# Patient Record
Sex: Male | Born: 1985 | Race: White | Hispanic: No | Marital: Single | Smoking: Never smoker
Health system: Southern US, Community
[De-identification: ages and names within clinical notes are randomized; demographics above are authoritative.]

## PROBLEM LIST (undated history)

## (undated) DIAGNOSIS — G4733 Obstructive sleep apnea (adult) (pediatric): Secondary | ICD-10-CM

## (undated) DIAGNOSIS — F32A Depression, unspecified: Secondary | ICD-10-CM

## (undated) DIAGNOSIS — E119 Type 2 diabetes mellitus without complications: Secondary | ICD-10-CM

## (undated) DIAGNOSIS — M549 Dorsalgia, unspecified: Secondary | ICD-10-CM

## (undated) DIAGNOSIS — K589 Irritable bowel syndrome without diarrhea: Secondary | ICD-10-CM

## (undated) DIAGNOSIS — F329 Major depressive disorder, single episode, unspecified: Secondary | ICD-10-CM

## (undated) DIAGNOSIS — R42 Dizziness and giddiness: Secondary | ICD-10-CM

## (undated) DIAGNOSIS — N200 Calculus of kidney: Secondary | ICD-10-CM

## (undated) DIAGNOSIS — M779 Enthesopathy, unspecified: Secondary | ICD-10-CM

## (undated) HISTORY — DX: Major depressive disorder, single episode, unspecified: F32.9

## (undated) HISTORY — DX: Depression, unspecified: F32.A

## (undated) HISTORY — DX: Obstructive sleep apnea (adult) (pediatric): G47.33

## (undated) HISTORY — DX: Calculus of kidney: N20.0

## (undated) HISTORY — DX: Dizziness and giddiness: R42

## (undated) HISTORY — DX: Irritable bowel syndrome, unspecified: K58.9

---
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
## 2006-01-03 ENCOUNTER — Ambulatory Visit: Payer: Self-pay | Admitting: Family Medicine

## 2006-01-29 ENCOUNTER — Ambulatory Visit: Payer: Self-pay | Admitting: Urology

## 2006-03-05 ENCOUNTER — Ambulatory Visit: Payer: Self-pay | Admitting: Urology

## 2007-01-24 ENCOUNTER — Ambulatory Visit: Payer: Self-pay | Admitting: Urology

## 2007-02-28 ENCOUNTER — Ambulatory Visit (HOSPITAL_COMMUNITY): Admission: RE | Admit: 2007-02-28 | Discharge: 2007-02-28 | Payer: Self-pay | Admitting: Urology

## 2007-05-08 ENCOUNTER — Encounter (INDEPENDENT_AMBULATORY_CARE_PROVIDER_SITE_OTHER): Payer: Self-pay | Admitting: Urology

## 2007-05-08 ENCOUNTER — Inpatient Hospital Stay (HOSPITAL_COMMUNITY): Admission: RE | Admit: 2007-05-08 | Discharge: 2007-05-10 | Payer: Self-pay | Admitting: Urology

## 2007-06-20 ENCOUNTER — Ambulatory Visit (HOSPITAL_BASED_OUTPATIENT_CLINIC_OR_DEPARTMENT_OTHER): Admission: RE | Admit: 2007-06-20 | Discharge: 2007-06-20 | Payer: Self-pay | Admitting: Urology

## 2007-08-12 ENCOUNTER — Ambulatory Visit (HOSPITAL_COMMUNITY): Admission: RE | Admit: 2007-08-12 | Discharge: 2007-08-12 | Payer: Self-pay | Admitting: Urology

## 2007-09-15 IMAGING — CT CT ABD-PELV W/ CM
2 of 5 series · 16 of 46 positions shown, 18 images · non-contrast
Comparison: none

REASON FOR EXAM: delays    hydronephrosis
COMMENTS:

[Series 2: abdomen · axial · 0.78mm/px · z∈[-78,+338]mm · 13 of 62 slices shown, 15 images]
[im 5/62  soft-tissue]
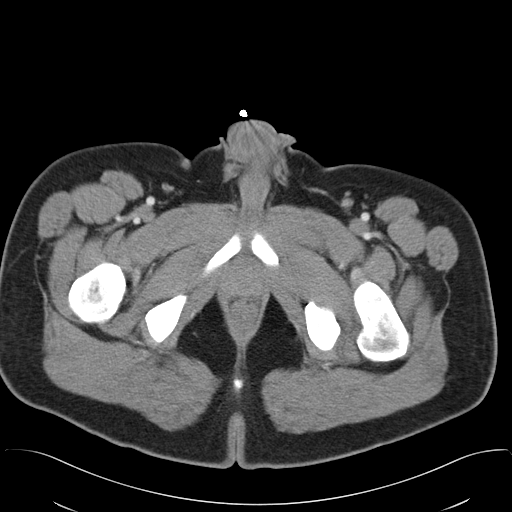
[im 5/62  bone]
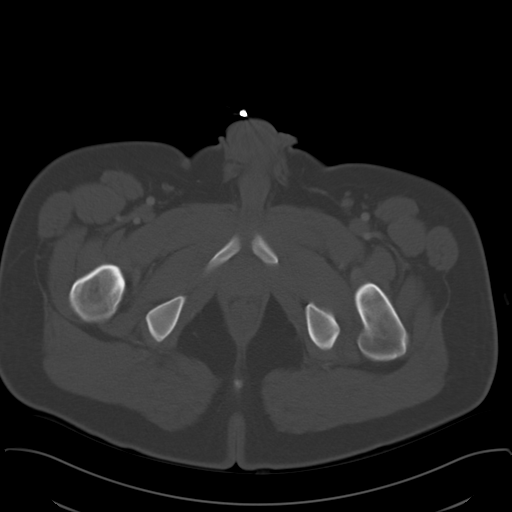
[im 9/62  soft-tissue]
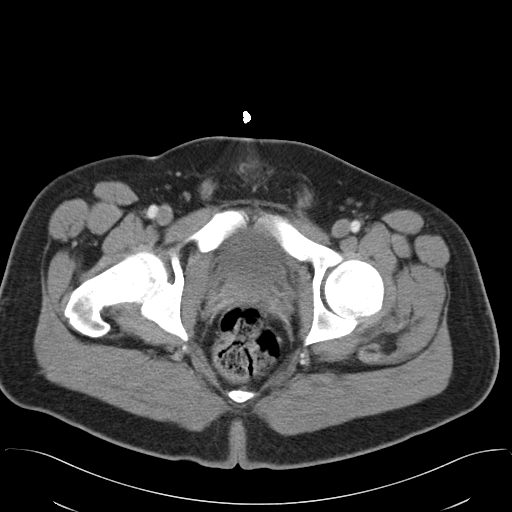
[im 14/62  soft-tissue]
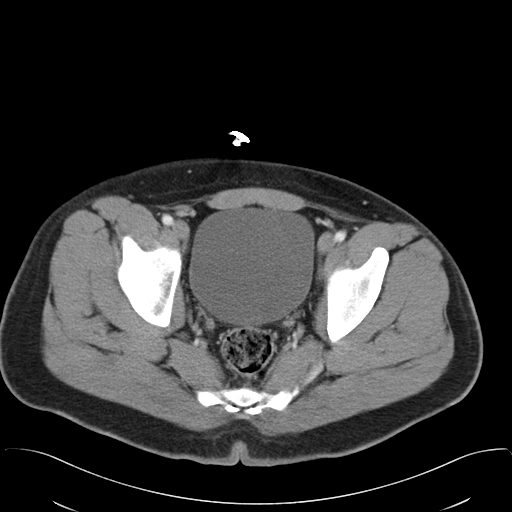
[im 18/62  soft-tissue]
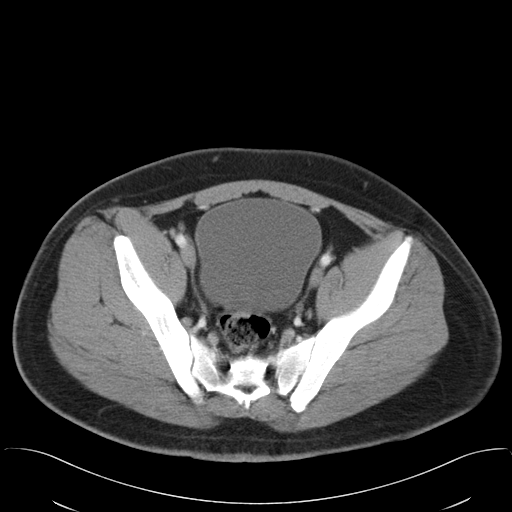
[im 22/62  soft-tissue]
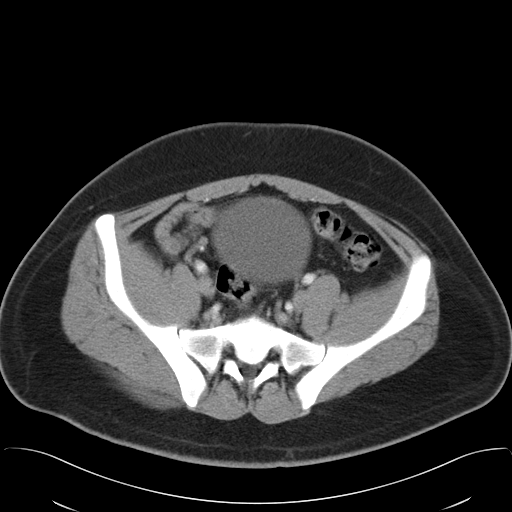
[im 27/62  soft-tissue]
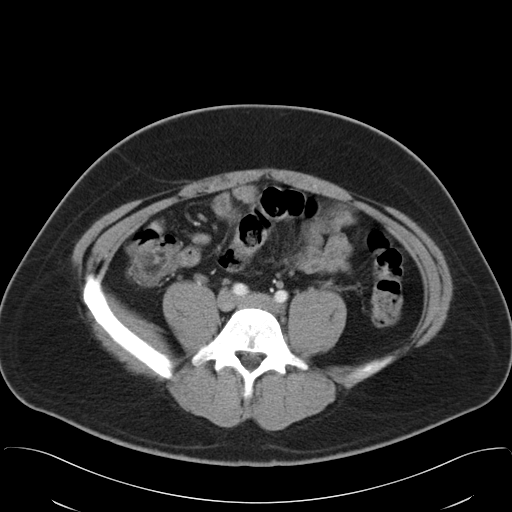
[im 31/62  soft-tissue]
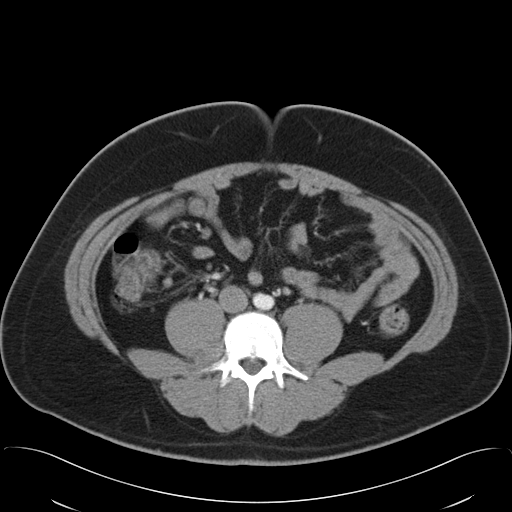
[im 35/62  soft-tissue]
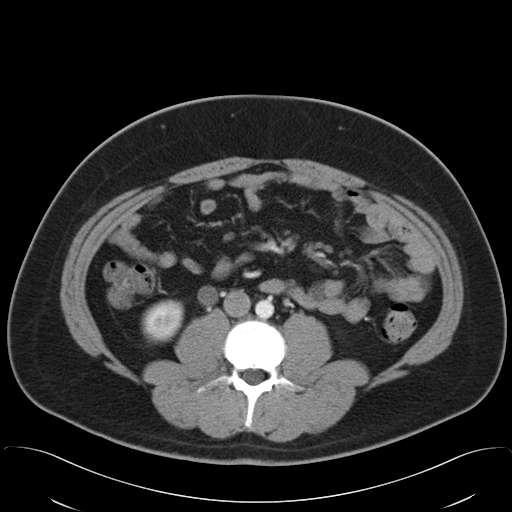
[im 40/62  soft-tissue]
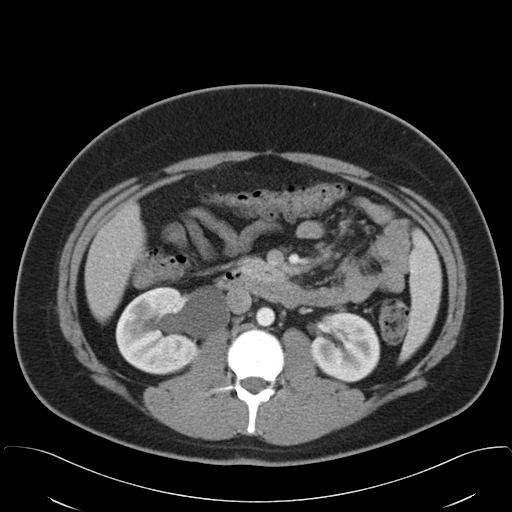
[im 40/62  bone]
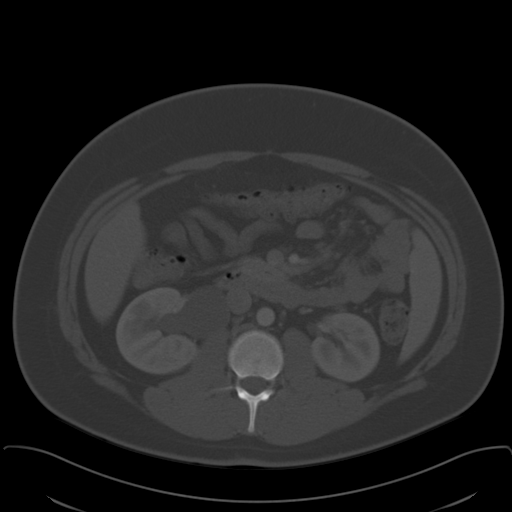
[im 44/62  soft-tissue]
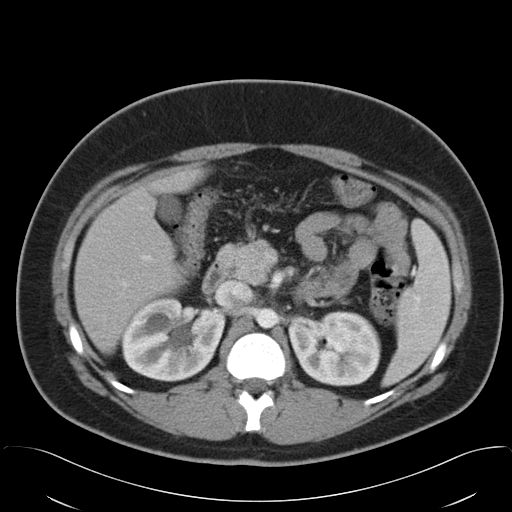
[im 48/62  soft-tissue]
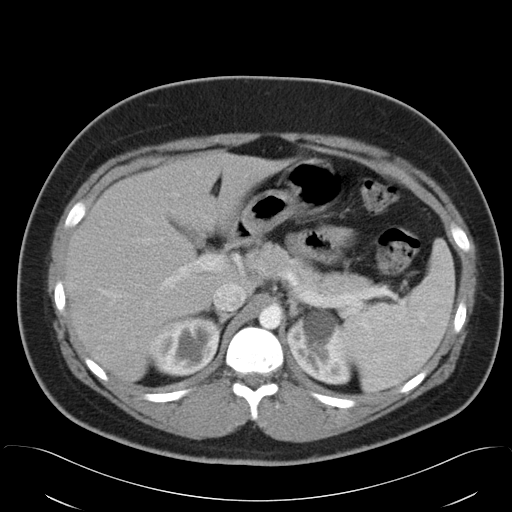
[im 53/62  soft-tissue]
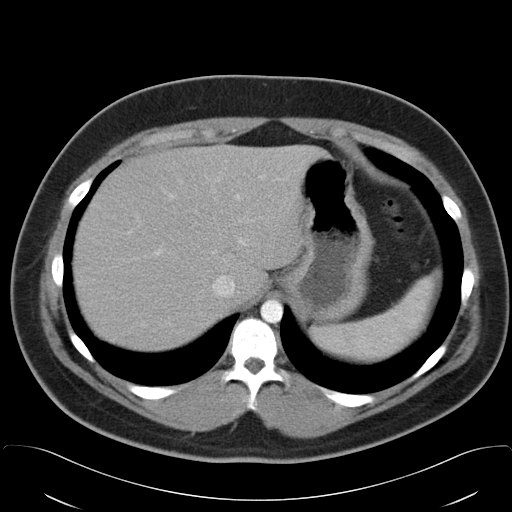
[im 57/62  soft-tissue]
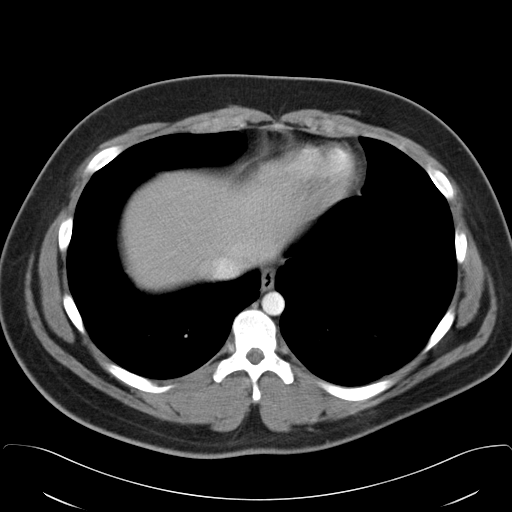

[Series 602: <mpr thick range> · coronal · 0.99mm/px · 3 of 37 slices shown]
[im 13/37  soft-tissue]
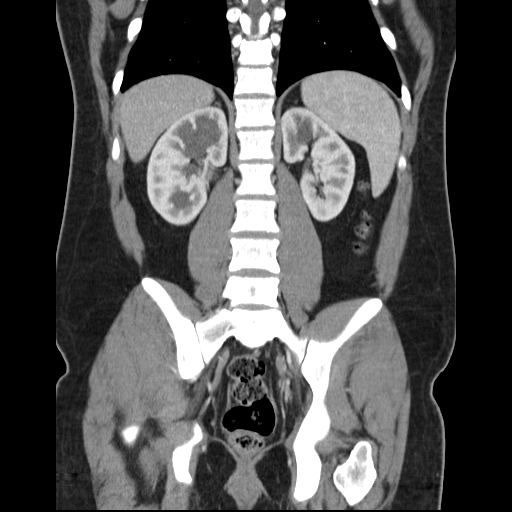
[im 17/37  soft-tissue]
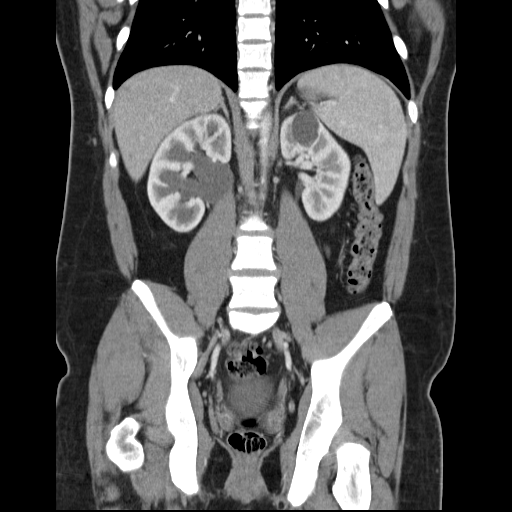
[im 21/37  soft-tissue]
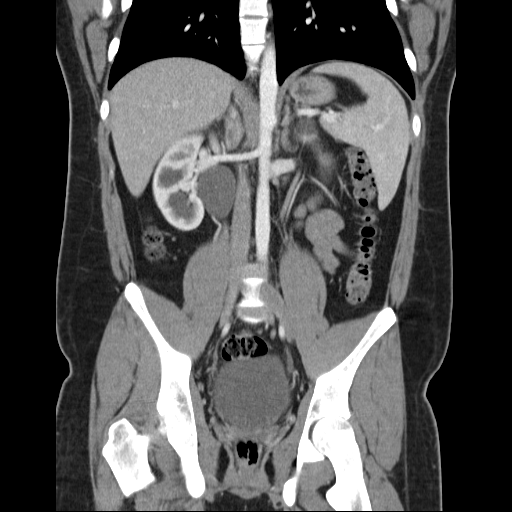

[16 of 46 positions shown; findings below may reference images not displayed]

PROCEDURE:     CT  - CT ABDOMEN / PELVIS  W  - March 05, 2006 [DATE]

RESULT:     IV contrast enhanced CT scan of the abdomen and pelvis with
delayed images is compared to the prior study of 01-29-06.  Today's
examination again shows prominence of the RIGHT renal collecting system with
what appears to be an extrarenal pelvis. No definite level of obstruction is
seen. On the delayed images the renal pelvis and collecting system are not
completely opacified and therefore no contrast is seen in the RIGHT ureter.
The appearance suggests the possibility of an ureteropelvic junction
stenosis. On the LEFT there is noted a cyst in the upper pole of the LEFT
kidney showing an AP dimension of 2.7 cm and a transverse dimension of
cm.  No other focal renal masses or cysts are evident. There is no evidence
of retroperitoneal adenopathy. The aorta is normal in caliber. There is no
significant abnormality seen in the bladder. No stones are evident. The
upper abdominal viscera otherwise appear to be normal.  There is no free
fluid or free air.
IMPRESSION: 1)Probable RIGHT sided UPJ stenosis with hydronephrosis.  Correlation with
retrograde examination may be beneficial.

2)Note is made that the lung bases are normally aerated and clear.

Thank you for the opportunity to contribute to the care of your patient.

ADDENDUM:  03-21-06

Review of the study is made with Dr. Shanrich.  The area of transition from the
hydronephrotic ureter to normal caliber ureter appears to be somewhat lower
than would be expected with a traditional ureteropelvic junction. It is
noted that there are vessels anterior and posterior to the ureter near the
transition site and the possibility of some vascular component to this
stenosis cannot be excluded. During the course of this evaluation it is
noted that there are accessory renal arteries to the LEFT as well.

## 2008-04-16 HISTORY — PX: KIDNEY SURGERY: SHX687

## 2008-06-15 ENCOUNTER — Ambulatory Visit: Payer: Self-pay | Admitting: Urology

## 2009-11-23 ENCOUNTER — Ambulatory Visit: Payer: Self-pay | Admitting: Urology

## 2009-11-28 ENCOUNTER — Ambulatory Visit: Payer: Self-pay | Admitting: Urology

## 2009-12-01 ENCOUNTER — Ambulatory Visit: Payer: Self-pay | Admitting: Urology

## 2009-12-09 ENCOUNTER — Ambulatory Visit: Payer: Self-pay | Admitting: Urology

## 2010-04-12 ENCOUNTER — Ambulatory Visit: Payer: Self-pay | Admitting: Urology

## 2010-04-16 HISTORY — PX: URETERAL STENT PLACEMENT: SHX822

## 2010-04-20 ENCOUNTER — Ambulatory Visit: Payer: Self-pay | Admitting: Urology

## 2010-05-05 ENCOUNTER — Ambulatory Visit: Payer: Self-pay | Admitting: Urology

## 2010-05-16 ENCOUNTER — Ambulatory Visit: Payer: Self-pay | Admitting: Urology

## 2010-05-18 ENCOUNTER — Emergency Department: Payer: Self-pay | Admitting: Emergency Medicine

## 2010-08-29 NOTE — Op Note (Signed)
NAMEKASAI, BELTRAN                ACCOUNT NO.:  [id number]   MEDICAL RECORD NO.:  [id number]          PATIENT TYPE:  INP   LOCATION:  1436                         FACILITY:  [facility]   PHYSICIAN:  Heloise Purpura, MD      DATE OF BIRTH:  March 15, 1986   DATE OF PROCEDURE:  05/08/2007  DATE OF DISCHARGE:  05/10/2007                               OPERATIVE REPORT   PREOPERATIVE DIAGNOSIS:  Right ureteropelvic junction obstruction.   POSTOPERATIVE DIAGNOSIS:  Right ureteropelvic junction obstruction.   PROCEDURE:  1. Cystoscopy.  2. Right retrograde pyelography.  3. Right ureteroscopy.  4. Right ureteral stent placement.  5. Right robotic-assisted laparoscopic dismembered pyeloplasty.  6. Right nephropexy.   SURGEON:  Heloise Purpura, MD   ASSISTANT:  Jennet Maduro, MD   ANESTHESIA:  General.   COMPLICATIONS:  None.   ESTIMATED BLOOD LOSS:  50 mL.   INTRAVENOUS FLUIDS:  2500 mL of lactated Ringer's.   SPECIMEN:  Right proximal ureter with ureteral polyps.   DISPOSITION OF SPECIMEN:  To pathology.   DRAINS:  1. A 16-French Foley catheter.  2. A #15 Blake perinephric drain.   INTRAOPERATIVE FINDINGS:  Right retrograde pyelography demonstrated  significant abnormality of the proximal ureter including multiple small  filling defects at 2 levels of the most proximal ureter.  There was  associated dilation of the renal pelvis and calyceal system of the right  kidney, but no filling defects in the renal collecting system.  No other  identifiable filling defects were noted along the course of the mid and  distal ureter.  Diagnostic ureteroscopy revealed finger-like projections  which appeared smooth and consistent with fibroepithelial polyps.  These  did not appear to raise concern for malignancy.   INDICATIONS:  Jacob Andrews is a 25 year old patient with chronic intermittent  right-sided flank and upper abdominal pain which has occurred for the  majority of his life.  [place] which demonstrated a right  hydronephrosis and findings consistent with a probable UPJ obstruction.  After continuing to have symptoms, he was seen in consultation for a  possible repair of the UPJ obstruction.  The patient elected to proceed  with the above procedures.  Potential risks/benefits, potential  complications, and alternative options were discussed in detail with the  patient and informed consent was obtained.   DESCRIPTION OF PROCEDURE:  The patient was taken to the operating room  and a general anesthetic was administered.  He was given preoperative  antibiotics, placed in the dorsal lithotomy position, and prepped and  draped in the usual sterile fashion.  Next, a preoperative time-out was  performed.  Cystourethroscopy was then performed, which demonstrated no  abnormalities of the urethra or bladder.  The right ureteral orifice was  identified and intubated with a 6-French ureteral catheter.  Contrast  was injected, which revealed findings as stated above; specifically,  there did appear to be a filling defect and abnormality in 2 distinct  areas of the proximal ureter.  Therefore, a 0.038 Sensor guidewire was  advanced up the into the renal pelvis under fluoroscopic guidance and  without difficulty.  A flexible ureteroscope was then advanced over this  wire and the proximal ureter was examined visually.  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  There was  a second area just inferior to the initial site which had smaller, but  similar-appearing projections; these did not appear to represent a  malignancy.  At this point, a quick literature search was performed in  the operating room, helping to confirm the probability that the entities  visualized represented fibroepithelial polyps.   Based on the patient's  age and the fact that he did not have any risk factors for malignancy,  it was decided to proceed with resection and repair of his proximal  ureter.  The 0.038 Sensor guidewire was advanced through the  ureteroscope and up into the renal pelvis again without difficulty.  The  wire was back-loaded over a cystoscope and an 8 x 28 double-J ureteral  stent was advanced over the wire and appropriately positioned under  fluoroscopic and cystoscopic guidance.  The wire was then removed with a  good curl noted in the renal pelvis as well as in the bladder.  A 16-  French Foley catheter was placed and the patient was then repositioned  in the right modified flank position with care to pad all potential  pressure points.  His abdomen was prepped and draped in the usual  sterile fashion and a site was selected just to the right of the  umbilicus for placement of the camera port; this was placed using a  standard open Hasson technique, which allowed entry into the peritoneal  cavity under direct vision and without difficulty.  A 12-mm port was  placed and a pneumoperitoneum was established.  A 0-degree lens was then  used to inspect the abdomen and there was no evidence of any intra-  abdominal injuries or other abnormalities.  The remaining ports were  then placed.  An 8-mm robotic port was placed in the right upper  quadrant and an additional 8-mm robotic port was placed in the right  lower quadrant.  Finally, an additional 8-mm robotic port was placed in  the far inferior and lateral aspect of the right lower quadrant.  All of  these ports were placed under direct vision and without difficulty.  An  additional 12-mm port was placed in the midline superior to the  umbilicus for laparoscopic assistance.  All ports were placed under  direct vision and without difficulty.  The surgical cart was then  docked.  With the aid of the cautery scissors and the [place] bipolar   forceps, the white line of Toldt was incised along the length of the  ascending colon, allowing the colon to be mobilized medially in the  space between the colonic mesentery and Gerota's fascia to be developed.  The ureter was easily identified and was lifted anteriorly off the psoas  muscle with preservation of the gonadal vein.  Care was taken to  preserve the periureteral tissue and blood supply.  The ureter was then  dissected proximally until the renal pelvis was identified.  The renal  pelvis was isolated from the surrounding structures up to the level of  the renal vein.  At this point, the ureter was carefully identified.  There was noted to be an area in the proximal ureter that did  appear  somewhat dysplastic and consistent with the abnormalities seen  ureteroscopically.  The ureter was then incised longitudinally in an  anterior fashion, which revealed the abnormal polypoid lesions within  the proximal ureter.  This area was incised along the length of the  abnormal ureter until normal ureter was seen distally as well as  proximally.  The identified abnormal segment of the proximal ureter was  then excised; this represented an approximately 2.5- to 3-cm area in  length of the proximal ureter.  At this point, it was clear that the  renal pelvis and the proximal aspect of the normal ureter would not be  able to be repaired without tension.  It was decided to fully mobilize  the kidney for a tension-free repair to be performed.  Therefore,  Gerota's fascia was incised, allowing the kidney to be completely  mobilized posteriorly, laterally and superiorly.  The renal vessels were  identified and were isolated, which allowed significant downward  mobilization of the kidney.  The perinephric fat on the inferior and  posterior aspect of the kidney along with some remaining Gerota's fascia  were then secured to the fascia of the psoas muscle with figure-of-eight  2-0 Vicryl sutures in  order to perform a nephropexy and secure the  kidney in the downward position.  At this point, it was felt that a  direct reanastomosis between the renal pelvis and remaining proximal  ureter could be performed in a tension-free fashion.  The periureteral  fat was reapproximated to fat adjacent to the lateral aspect of the  renal pelvis to again help take tension off of the anastomosis.  The  renal pelvis and proximal ureter were spatulated and 4-0 Vicryl sutures  were then used to reapproximate the lateral and medial aspects of the  renal pelvis and ureter in a tension-free fashion.  The posterior  anastomosis was then performed with a running 4-0 Vicryl suture.  The  patient's ureteral stent was then repositioned within the renal pelvis  and the anterior aspect of the anastomosis was closed with a running 4-0  Vicryl suture.  A #15 Blake drain was then brought through the far  inferior and lateral right lower quadrant port and positioned externally  to the anastomosis.  It was secured to the skin with a nylon suture.  The colon was then repositioned in its normal position over the ureter  and renal hilum.  The two 12-mm port sites were then closed with 0  Vicryl sutures which were placed with the aid of the suture passer  device.  The proximal ureteral specimen was removed within an Endopouch  retrieval bag and sent for permanent pathologic analysis.  Remaining  ports were removed under direct vision and the pneumoperitoneum  expelled.  All skin incisions were injected with 0.25% Marcaine and  reapproximated at the skin level with staples.  Sterile dressings were  applied.  The patient appeared to tolerate the procedure well and  without complications.  He was able to be extubated and transferred to  the recovery unit in satisfactory condition.      Heloise Purpura, MD  Electronically Signed     LB/MEDQ  D:  05/10/2007  T:  05/11/2007  Job:  [id number]

## 2010-08-29 NOTE — Discharge Summary (Signed)
[name], [name]                ACCOUNT NO.:  [id number]   MEDICAL RECORD NO.:  [id number]          PATIENT TYPE:  INP   LOCATION:  1436                         FACILITY:  [facility]   PHYSICIAN:  Heloise Purpura, MD      DATE OF BIRTH:  1986-02-07   DATE OF ADMISSION:  05/08/2007  DATE OF DISCHARGE:  05/10/2007                               DISCHARGE SUMMARY   ADMISSION DIAGNOSIS:  Right UPJ obstruction.   DISCHARGE DIAGNOSIS:  Right UPJ obstruction.   HISTORY OF PRESENT ILLNESS:  For full details please see admission  history and physical.  Briefly, Jacob Andrews is a 25 year old patient with  intermittent right-sided flank pain.  He has subsequently been found on  imaging to have findings consistent with a right UPJ obstruction.  After  discussion regarding management options for treatment, he elected to  proceed with surgical treatment and a dismembered pyeloplasty.   HOSPITAL COURSE:  On May 08, 2007, he was taken to the operating  room and underwent cystoscopy, retrograde pyelography, ureteroscopy,  ureteral stent placement, and a right robotic assisted laparoscopic  dismembered pyeloplasty.  He tolerated this procedure well and without  complications.  Postoperatively, he was able to be transferred to a  regular hospital room following recovery from anesthesia.  He was able  to begin ambulating the night of surgery, which he did without  difficulty.  He remained hemodynamically stable and his hematocrit and  renal function were checked the morning of postoperative day #1.  His  hematocrit remained stable at 39.8 and his serum creatinine remained  stable at 0.87.  On postoperative day #1, his diet was advanced until he  was tolerating a regular diet.  He was transitioned to oral pain  medication and his Foley catheter was removed.  His perinephric drain  was monitored and continued to have minimal output.  On the morning of  postoperative day #2, his drain fluid was sent for a  creatinine level  and was consistent with serum at 0.7.  He was therefore able to be  removed and was able to be discharged home in excellent condition.   DISPOSITION:  Home.   DISCHARGE MEDICATIONS:  Jacob Andrews was given a prescription to take Vicodin  as needed for pain and told to use Colace as a stool softener.   DISCHARGE INSTRUCTIONS:  He was instructed to be ambulatory, but  specifically told to refrain from any heavy lifting, strenuous activity,  or driving.  He was instructed that he may resume a regular diet.   FOLLOW-UP:  He will follow-up in the next 10-14 days for further  postoperative evaluation.      Heloise Purpura, MD  Electronically Signed     LB/MEDQ  D:  05/10/2007  T:  05/11/2007  Job:  [id number]

## 2010-08-29 NOTE — H&P (Signed)
NAMEJASIR, ROTHER                ACCOUNT NO.:  [id number]   MEDICAL RECORD NO.:  [id number]          PATIENT TYPE:  INP   LOCATION:  0005                         [facility]ACILITY:  [facility]   PHYSICIAN:  Heloise Purpura, MD      DATE OF BIRTH:  1985-10-01   DATE OF ADMISSION:  05/08/2007  DATE OF DISCHARGE:                              HISTORY & PHYSICAL   CHIEF COMPLAINT:  Right UPJ obstruction.   HISTORY:  Francesco Runner is a 25 year old patient with a history of right flank  pain.  He was recently found to have right hydronephrosis and findings  consistent with a ureteropelvic junction obstruction on a CT scan.  He  underwent a nuclear medicine renal scan which confirmed his diagnosis.  There is question of a crossing of vessel as the possible etiology.  After discussion regarding management options for treatment, the patient  elected to proceed with a minimally invasive dismembered pyeloplasty.   PAST MEDICAL HISTORY:  None.   PAST SURGICAL HISTORY:  None.   MEDICATIONS:  None.   ALLERGIES:  No known drug allergies.   FAMILY HISTORY:  Positive for coronary artery disease and lung cancer.   SOCIAL HISTORY:  The patient denies alcohol or tobacco use.   REVIEW OF SYSTEMS:  All systems are reviewed and are negative.   PHYSICAL EXAMINATION:  CONSTITUTIONAL:  Well-nourished, well-developed,  age-appropriate male in no acute distress.  CARDIOVASCULAR:  Regular rate and rhythm without obvious murmurs.  LUNGS:  Clear bilaterally.  ABDOMEN:  Obese, soft, nontender without abdominal masses.  BACK:  No current CVA tenderness.  EXTREMITIES:  No edema.   IMPRESSION:  Right ureteropelvic junction obstruction.   PLAN:  Francesco Runner will undergo cystoscopy with right ureteral stent  placement.  He will then undergo a robotic assisted laparoscopic  dismembered pyeloplasty and be admitted to the hospital for routine  postoperative care.      Heloise Purpura, MD  Electronically Signed     LB/MEDQ  D:   05/08/2007  T:  05/08/2007  Job:  [id number]

## 2010-08-29 NOTE — Op Note (Signed)
[name], [name]                ACCOUNT NO.:  [id number]   MEDICAL RECORD NO.:  [id number]          PATIENT TYPE:  AMB   LOCATION:  [facility]                         [facility]ACILITY:  [facility]   PHYSICIAN:  Heloise Purpura, MD      DATE OF BIRTH:  07/13/1985   DATE OF PROCEDURE:  06/20/2007  DATE OF DISCHARGE:                               OPERATIVE REPORT   PREOPERATIVE DIAGNOSES:  1. History of right ureteropelvic junction obstruction.  2. Indwelling right ureteral stent.   POSTOPERATIVE DIAGNOSES:  1. History of right ureteropelvic junction obstruction.  2. Indwelling right ureteral stent.   PROCEDURES:  1. Cystoscopy.  2. Removal of right ureteral stent.   SURGEON:  Heloise Purpura, MD   ANESTHESIA:  General.   COMPLICATIONS:  None.   INDICATIONS:  Jacob Andrews is a 25 year old gentleman who recently underwent a  right robotic pyeloplasty for a UPJ obstruction.  He came to the office  earlier this morning for ureteral stent removal.  The stent was unable  to be removed easily and the patient was tolerating this procedure  poorly in the office.  Therefore, it was decided to proceed with removal  of the stent under anesthesia.  Potential risks, complications and  alternative options were discussed with the patient in detail.  An  informed consent was obtained.   DESCRIPTION OF PROCEDURE:  The patient was taken to the operating room  and a general anesthetic was administered.  He was given preoperative  antibiotics, placed in the dorsal lithotomy position, prepped and draped  in the usual sterile fashion.  Next a preoperative time-out was  performed.  Cystourethroscopy was performed and the patient's ureteral  stent was identified and was able to be pulled out of the urethra  without difficulty.  Fluoroscopic guidance was used to monitor the  proximal curl of the stent which appeared to also come out without  difficulty.  The patient tolerated the procedure well without  complications.  His  bladder was emptied prior to returning to the  recovery unit.      Heloise Purpura, MD  Electronically Signed    LB/MEDQ  D:  06/20/2007  T:  06/21/2007  Job:  [id number]

## 2011-01-04 LAB — HEMOGLOBIN AND HEMATOCRIT, BLOOD
HCT: 39.8
HCT: 40.3
Hemoglobin: 14.2

## 2011-01-04 LAB — BASIC METABOLIC PANEL
BUN: 10
BUN: 8
Calcium: 8.6
Calcium: 9.5
Chloride: 102
Chloride: 104
Creatinine, Ser: 0.87
GFR calc Af Amer: >60
GFR calc Af Amer: >60
GFR calc Af Amer: >60
GFR calc non Af Amer: >60
Glucose, Bld: 116 — ABNORMAL HIGH
Potassium: 4.2
Sodium: 138
Sodium: 138
Sodium: 142

## 2011-01-04 LAB — CBC
Hemoglobin: 14.7
Platelets: 212
RBC: 4.67
RDW: 12.8

## 2011-01-04 LAB — CREATININE, FLUID (PLEURAL, PERITONEAL, JP DRAINAGE)

## 2011-01-04 LAB — TYPE AND SCREEN: Antibody Screen: NEGATIVE

## 2011-04-17 HISTORY — PX: APPENDECTOMY: SHX54

## 2011-06-06 ENCOUNTER — Ambulatory Visit: Payer: Self-pay | Admitting: Urology

## 2011-06-13 IMAGING — CR DG ABDOMEN 1V
1 series · 2 of 2 positions shown · non-contrast
Comparison: none

REASON FOR EXAM: right ureteral calculus
COMMENTS:

[Series 1: view not recorded · 0.17mm/px · 2 of 2 slices shown]
[im 1/2]
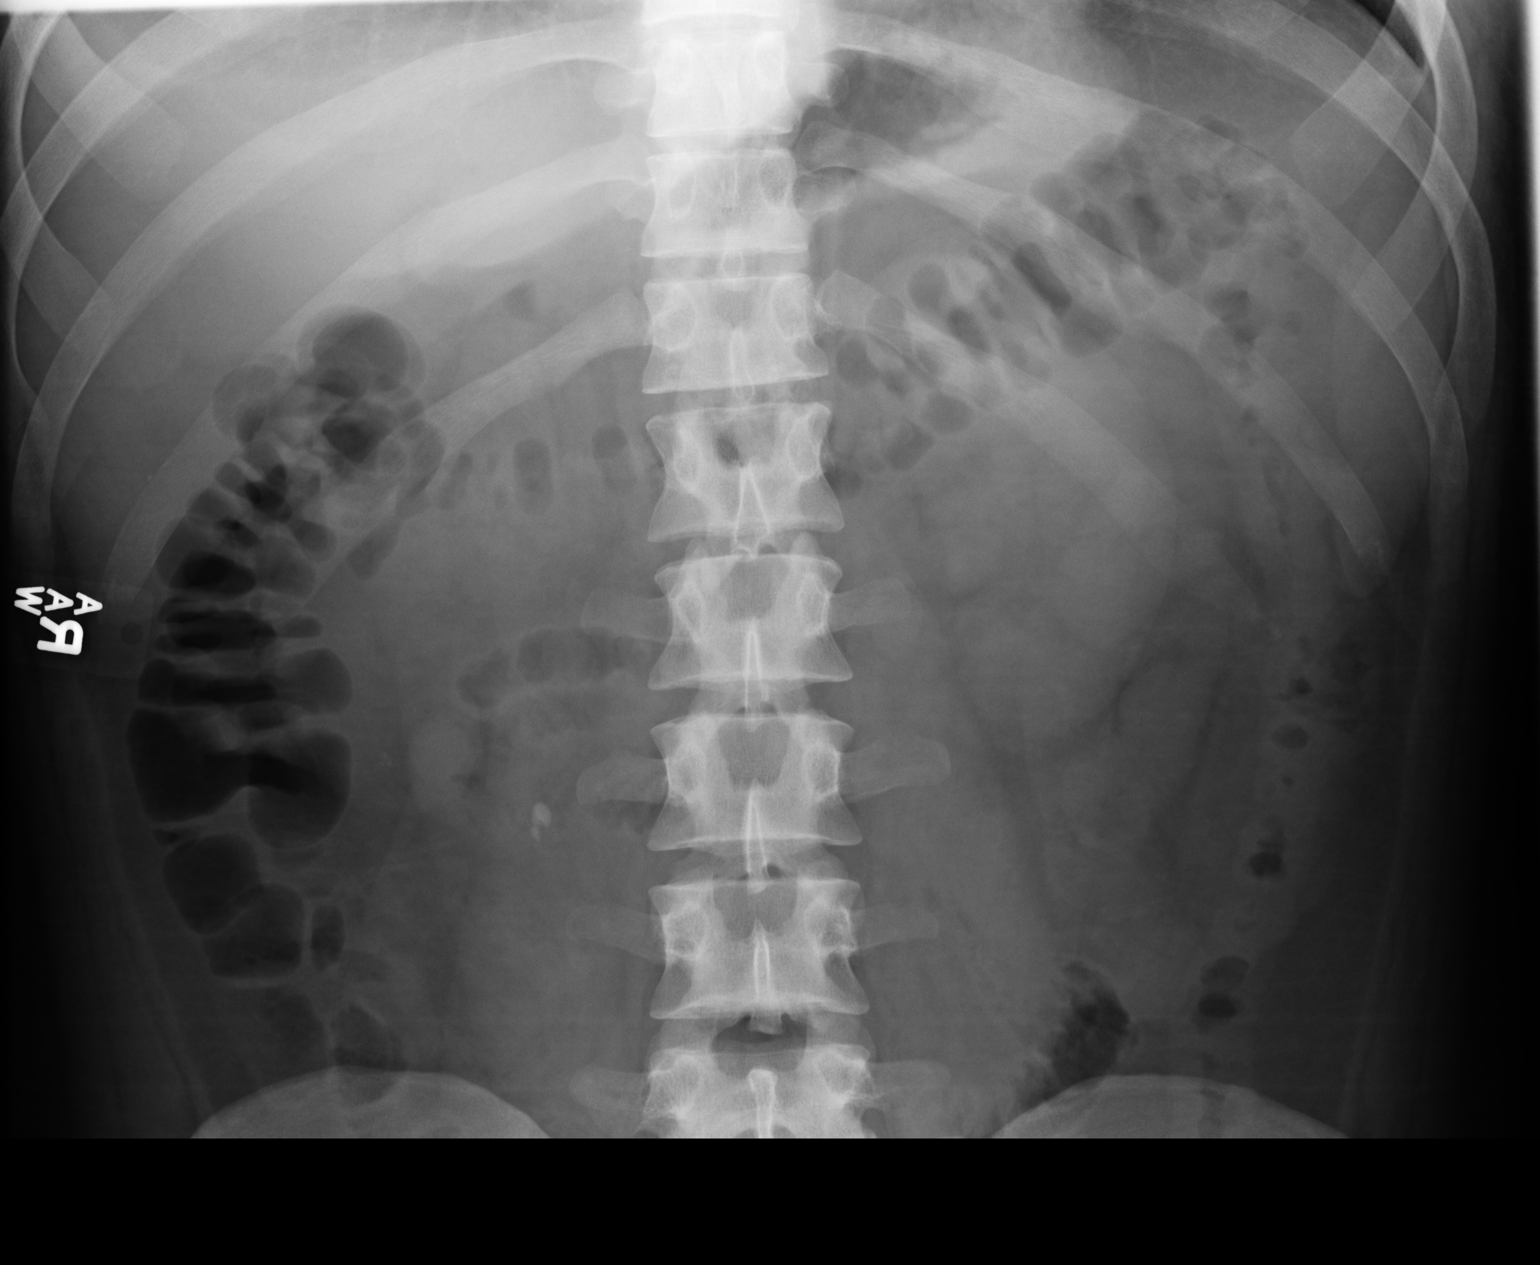
[im 2/2]
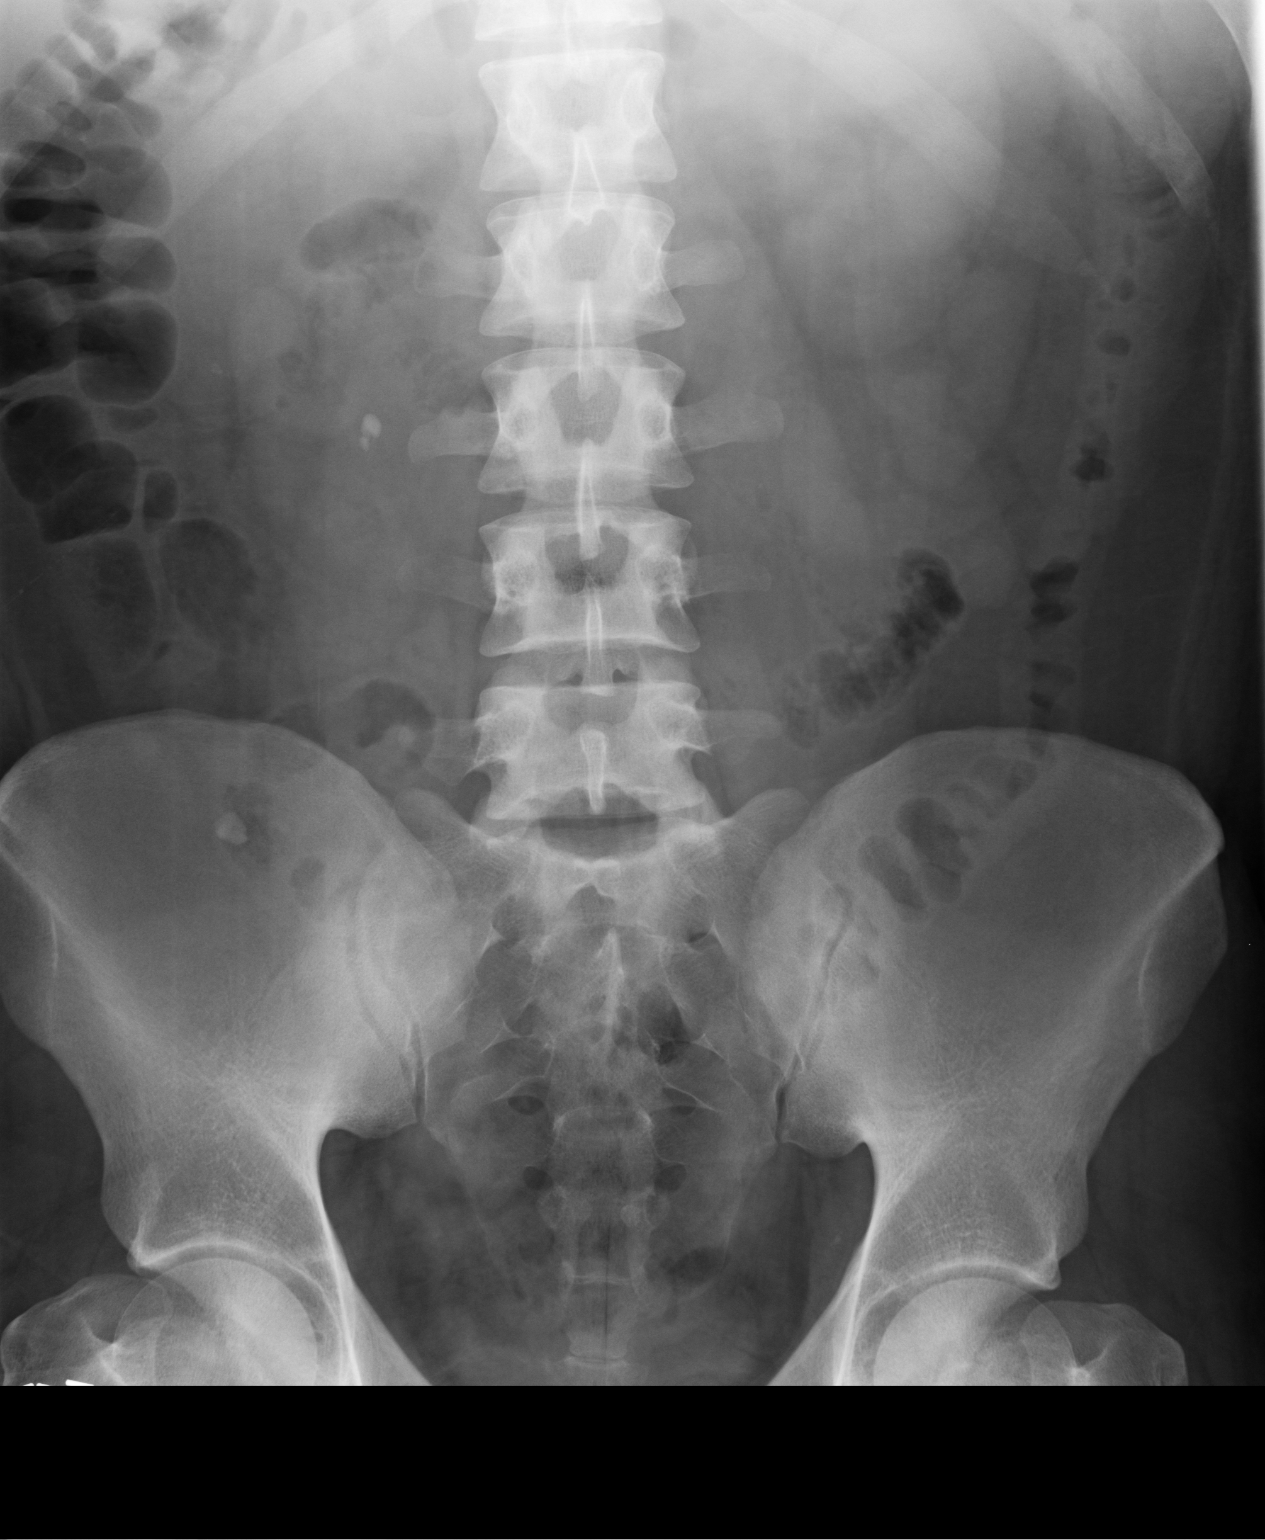

[2 of 2 positions shown; findings below may reference images not displayed]

PROCEDURE:     DXR - DXR KIDNEY URETER BLADDER  - December 01, 2009 [DATE]

RESULT:     Comparison is made to the prior exam of 11/28/2009. There are
again noted two calcifications or calcification fragments projected at the
level of the L3 lumbar vertebral body on the right and consistent with right
ureteral stones. Additionally, there is an apparent calcification at the
lower pole of the right kidney consistent with a right renal stone.
IMPRESSION: 1. Right ureterolithiasis.
2. Probable right nephrolithiasis.
3. No renal or ureteral calcifications are identified on the left.

## 2011-10-31 IMAGING — CR DG ABDOMEN 1V
1 series · 2 of 2 positions shown · non-contrast
Comparison: none

REASON FOR EXAM: Calculus right ureteral
COMMENTS:

[Series 1: view not recorded · 0.17mm/px · 2 of 2 slices shown]
[im 1/2]
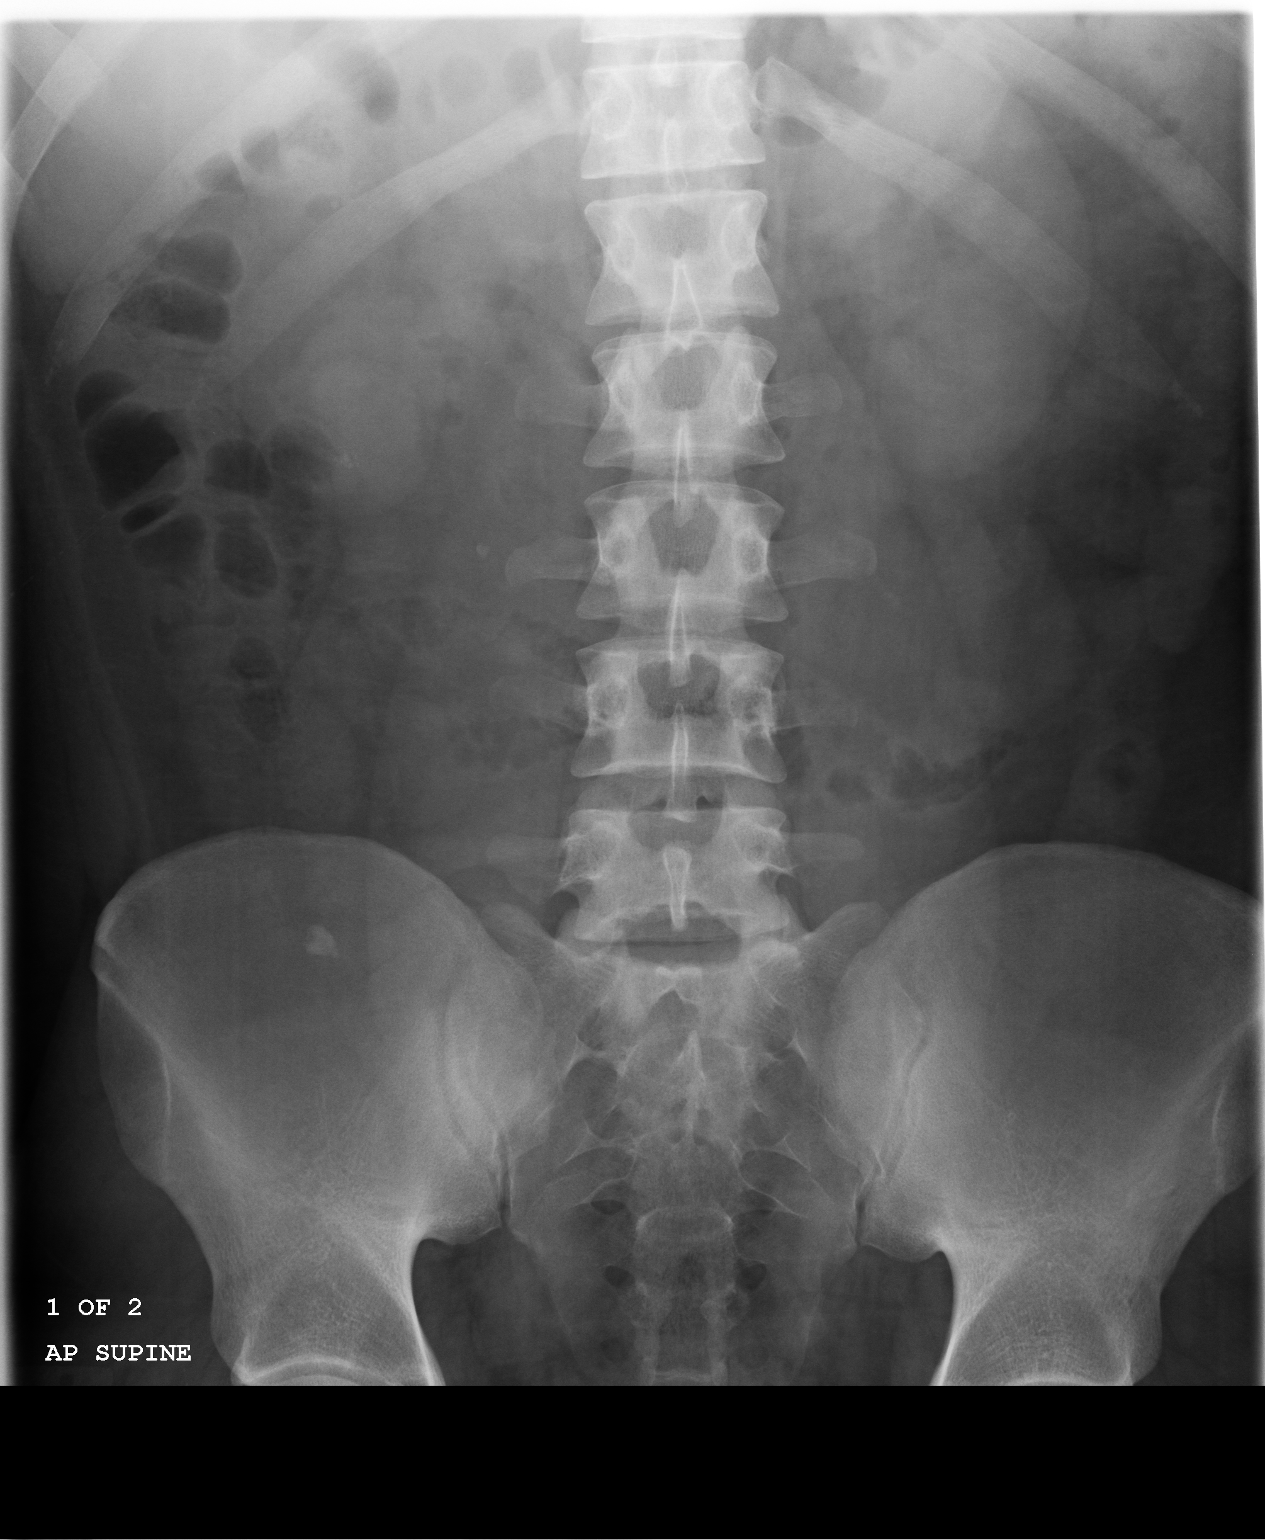
[im 2/2]
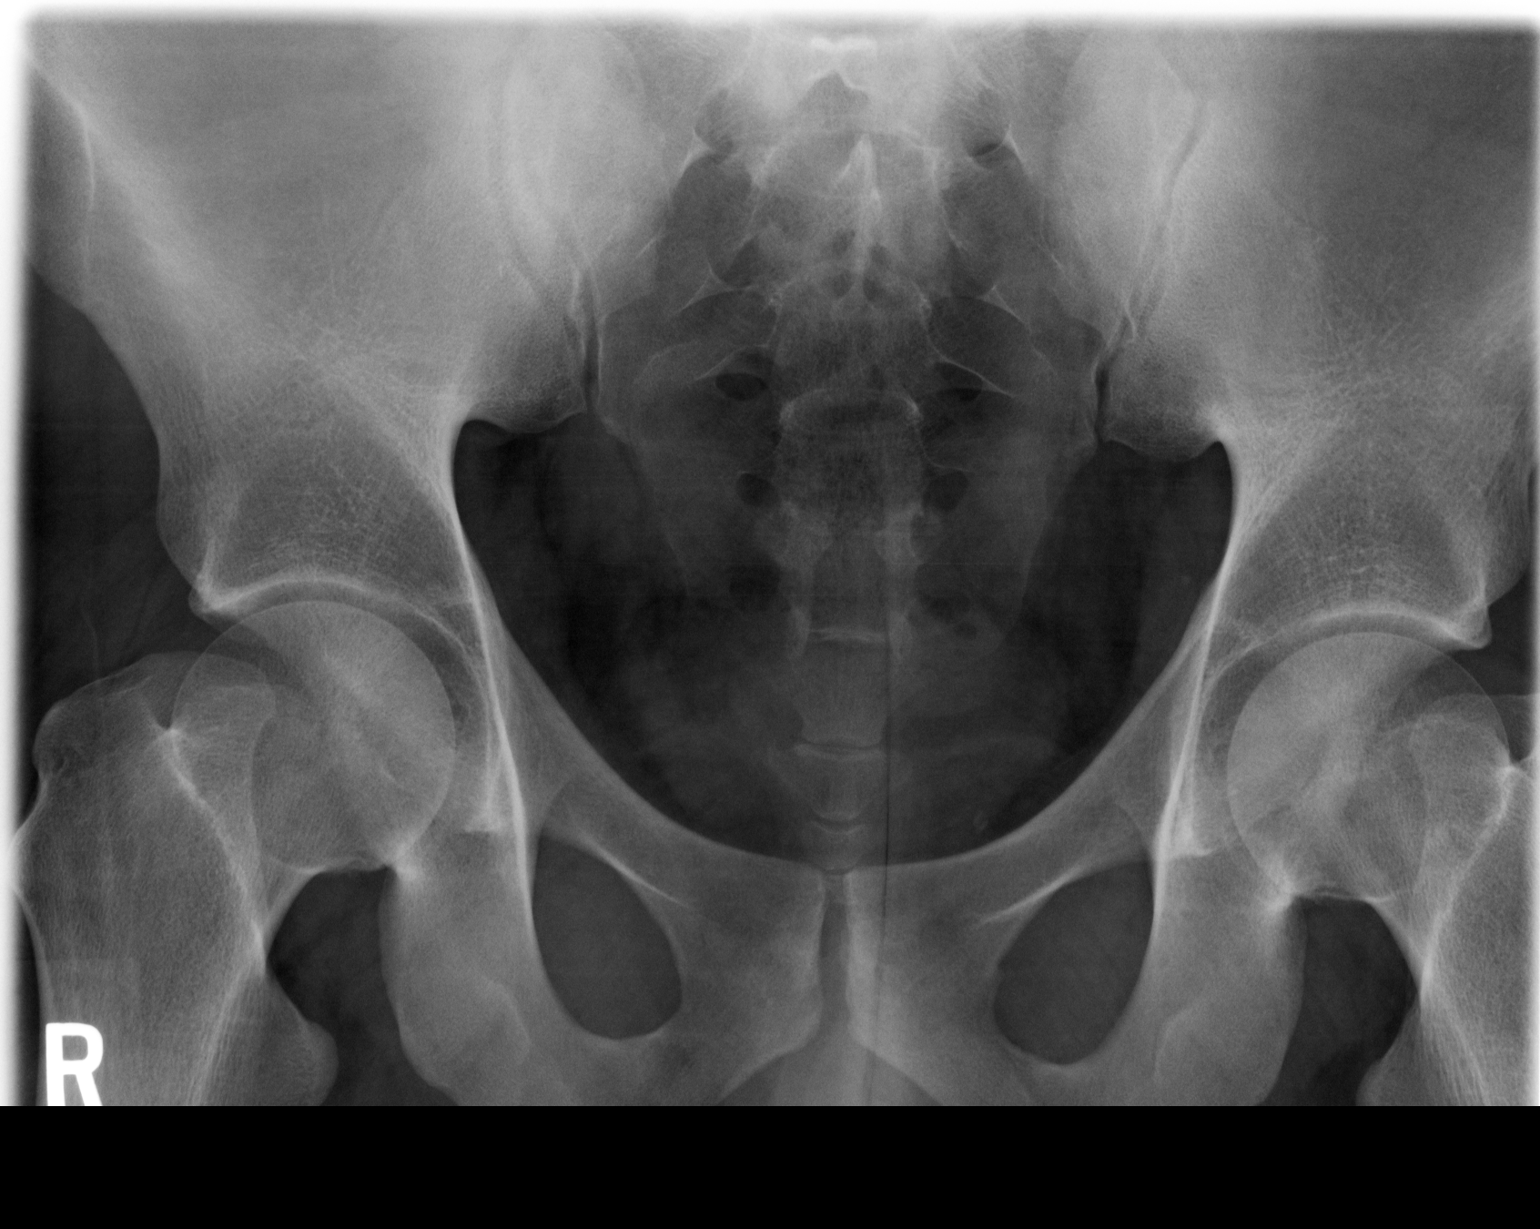

[2 of 2 positions shown; findings below may reference images not displayed]

PROCEDURE:     DXR - DXR KIDNEY URETER BLADDER  - April 20, 2010  [DATE]

RESULT:     Comparison is made to a prior study dated 04/12/2010.

Rounded calcified density projects just lateral to the transverse process of
L3 on the right. Small punctate calcified densities project within the lower
pole of the region right kidney. Coarse punctate area calcification projects
in the region of the right iliac wing. Air is seen within nondilated loops
large and small bowel. The visualized bony skeleton is unremarkable.
IMPRESSION: Findings again concerning for right-sided nephrolithiasis and possible
ureterolithiasis.

## 2012-03-23 ENCOUNTER — Ambulatory Visit: Payer: Self-pay

## 2012-03-23 ENCOUNTER — Ambulatory Visit: Payer: Self-pay | Admitting: Surgery

## 2012-03-23 LAB — COMPREHENSIVE METABOLIC PANEL
Anion Gap: 8 (ref 7–16)
Bilirubin,Total: 0.7 mg/dL (ref 0.2–1.0)
Calcium, Total: 8.9 mg/dL (ref 8.5–10.1)
Chloride: 101 mmol/L (ref 98–107)
Co2: 27 mmol/L (ref 21–32)
Creatinine: 0.79 mg/dL (ref 0.60–1.30)
EGFR (African American): >60
EGFR (Non-African Amer.): >60
Osmolality: 270 (ref 275–301)
Sodium: 136 mmol/L (ref 136–145)

## 2012-03-23 LAB — CBC WITH DIFFERENTIAL/PLATELET
Eosinophil #: 0.1 10*3/uL (ref 0.0–0.7)
Eosinophil %: 1 %
HCT: 43.7 % (ref 40.0–52.0)
HGB: 15.2 g/dL (ref 13.0–18.0)
Lymphocyte %: 14.4 %
MCHC: 34.7 g/dL (ref 32.0–36.0)
MCV: 89 fL (ref 80–100)
Monocyte #: 0.7 x10 3/mm (ref 0.2–1.0)
Neutrophil %: 78 %
Platelet: 248 10*3/uL (ref 150–440)
RBC: 4.92 10*6/uL (ref 4.40–5.90)
WBC: 12.4 10*3/uL — ABNORMAL HIGH (ref 3.8–10.6)

## 2012-03-23 LAB — URINALYSIS, COMPLETE
Bacteria: NONE SEEN
Bilirubin,UR: NEGATIVE
Glucose,UR: NEGATIVE mg/dL (ref 0–75)
Ketone: NEGATIVE
RBC,UR: 3 /HPF (ref 0–5)
Specific Gravity: >1.06 (ref 1.003–1.030)
WBC UR: 1 /HPF (ref 0–5)

## 2012-03-28 LAB — CULTURE, BLOOD (SINGLE)

## 2014-08-03 NOTE — Op Note (Signed)
PATIENT NAME:  Jacob Andrews, Jacob Andrews MR#:  [id number] DATE OF BIRTH:  09-28-1985  DATE OF PROCEDURE:  03/23/2012  OPERATION PERFORMED: Laparoscopic appendectomy.   PREOPERATIVE DIAGNOSIS: Acute appendicitis.   POSTOPERATIVE DIAGNOSIS: Acute appendicitis, not ruptured.   SURGEON: Consuela Mimes, MD   ANESTHESIA: General.   PROCEDURE IN DETAIL: The patient was placed supine on the operating room table and prepped and draped in the usual sterile fashion. A limited incision was made above the umbilicus and carried down through the subcutaneous tissue and a Hassan cannula was introduced amidst horizontal mattress sutures of 0 Vicryl in the linea alba and into the peritoneal cavity. A 15 mmHg CO2 pneumoperitoneum was created and two additional 5 mm trocars were placed. The right lower quadrant was inspected and the appendix was very distended and firm as was the mesoappendix and the appendix was stuck to the anterior abdominal wall. It was peeled off of this and then the antimesenteric fat from the terminal ileum was also stuck to it and this was peeled off and then the mesoappendix was taken down with the Harmonic scalpel and the appendectomy was performed at the base of the appendix just where it joined the cecum with the EndoGIA stapling device. The appendix was placed in an EndoCatch bag and extracted from the abdomen via the supraumbilical port. The right lower quadrant was irrigated with warm normal saline. This was suctioned clear including the pelvis and then the peritoneum was desufflated and decannulated and the linea alba was closed with a running 0 PDS suture and the previously placed Vicryls were tied one to another. All three skin sites were closed with subcuticular 5-0 Monocryl and suture strips. The patient tolerated the procedure well. There were no complications.   ____________________________ Consuela Mimes, MD wfm:drc D: 03/23/2012 16:46:01 ET T: 03/24/2012 07:29:42  ET JOB#: [id number]  cc: Consuela Mimes, MD, <Dictator> Consuela Mimes MD ELECTRONICALLY SIGNED 03/26/2012 7:54

## 2014-08-03 NOTE — H&P (Signed)
    History of Present Illness 26 yowm w/ 4 day h/o RLQ abdominal pain. Initially severe but improved and then this AM he was unable to move without exacerbating his pain. No nausea or vomiting. Last meal was last PM. Denies fever and chills.    Past History Ureteral stone   Past Med/Surgical Hx:  Kidney Stones:   Lithotripsy:   ALLERGIES:  Oxycodone: Headaches  Other -Explain in Comment: Swelling  HOME MEDICATIONS: Medication Instructions Status  acetaminophen-hydrocodone 325 mg-5 mg oral tablet 1  orally every 4 hours  Active  Cipro 250 mg oral tablet 1  orally 2 times a day  Active   Family and Social History:   Family History Non-Contributory    Social History positive tobacco (Greater than 1 year), positive ETOH, negative Illicit drugs, single, lives with girlfriend, no kids, works in Scientist, research (medical), smokes a hooka rarely, once a week ETOH    Place of Living Home   Review of Systems:   Fever/Chills No    Cough No    Sputum No    Abdominal Pain Yes    Diarrhea No    Constipation Yes    Nausea/Vomiting No    SOB/DOE No    Chest Pain No    Dysuria No    Tolerating PT Yes    Tolerating Diet No    Medications/Allergies Reviewed Medications/Allergies reviewed   Physical Exam:   GEN well developed, no acute distress, obese    HEENT pink conjunctivae, PERRL, hearing intact to voice, moist oral mucosa, Oropharynx clear    NECK supple  No masses  trachea midline    RESP normal resp effort  clear BS    CARD regular rate  no murmur  no Rub    ABD positive tenderness  mild RLQ pain with very mild rebound; no guarding    LYMPH negative neck    EXTR negative cyanosis/clubbing, negative edema    SKIN normal to palpation    NEURO cranial nerves intact, follows commands, strength:, motor/sensory function intact    PSYCH alert, A+O to time, place, person, good insight     Assessment/Admission Diagnosis Acute Appendicitis    Plan Lap Appy   Electronic  Signatures: Consuela Mimes (MD)  (Signed 08-Dec-13 14:14)  Authored: CHIEF COMPLAINT and HISTORY, PAST MEDICAL/SURGIAL HISTORY, ALLERGIES, HOME MEDICATIONS, FAMILY AND SOCIAL HISTORY, REVIEW OF SYSTEMS, PHYSICAL EXAM, ASSESSMENT AND PLAN   Last Updated: 08-Dec-13 14:14 by Consuela Mimes (MD)

## 2014-10-11 ENCOUNTER — Ambulatory Visit: Payer: Self-pay | Admitting: Family Medicine

## 2014-11-08 ENCOUNTER — Encounter: Payer: Self-pay | Admitting: Family Medicine

## 2014-11-08 ENCOUNTER — Ambulatory Visit (INDEPENDENT_AMBULATORY_CARE_PROVIDER_SITE_OTHER): Payer: 59 | Admitting: Family Medicine

## 2014-11-08 VITALS — BP 131/77 | HR 73 | Temp 98.2°F | Resp 16 | Ht 70.0 in | Wt 229.0 lb

## 2014-11-08 DIAGNOSIS — Z8249 Family history of ischemic heart disease and other diseases of the circulatory system: Secondary | ICD-10-CM

## 2014-11-08 DIAGNOSIS — F32A Depression, unspecified: Secondary | ICD-10-CM | POA: Insufficient documentation

## 2014-11-08 DIAGNOSIS — E739 Lactose intolerance, unspecified: Secondary | ICD-10-CM | POA: Diagnosis not present

## 2014-11-08 DIAGNOSIS — Z7189 Other specified counseling: Secondary | ICD-10-CM | POA: Diagnosis not present

## 2014-11-08 DIAGNOSIS — K589 Irritable bowel syndrome without diarrhea: Secondary | ICD-10-CM | POA: Diagnosis not present

## 2014-11-08 DIAGNOSIS — Z7689 Persons encountering health services in other specified circumstances: Secondary | ICD-10-CM

## 2014-11-08 DIAGNOSIS — F329 Major depressive disorder, single episode, unspecified: Secondary | ICD-10-CM | POA: Diagnosis not present

## 2014-11-08 MED ORDER — SERTRALINE HCL 50 MG PO TABS: 50.0000 mg | ORAL_TABLET | Freq: Every day | ORAL | Status: DC

## 2014-11-08 MED ORDER — DICYCLOMINE HCL 20 MG PO TABS: 20.0000 mg | ORAL_TABLET | Freq: Three times a day (TID) | ORAL | Status: DC

## 2014-11-08 MED ORDER — CHOLECALCIFEROL 50 MCG (2000 UT) PO CAPS: 1.0000 | ORAL_CAPSULE | Freq: Every day | ORAL | Status: DC

## 2014-11-08 NOTE — Assessment & Plan Note (Signed)
Pt would like to start treatment today. Zoloft 50 mg daily. Side effects reviewed. Encouraged 30 minutes of exercise daily.  Check TSH and vitamin D.  Encouraged OTC vitamin D to help with energy levels.   RTC 3 weeks for medication efficacy check.

## 2014-11-08 NOTE — Assessment & Plan Note (Signed)
Symptoms controlled with avoidance of dairy.

## 2014-11-08 NOTE — Patient Instructions (Addendum)
Depression: Start Zoloft 50mg  once daily. Break the first 2 tablets in 1/2 and take a 1/2 dose for the first 4 days at bedtime. Then resume full dose. We will check in on your depression in about 3 weeks. Try to get 30 minutes of exercise at least 3-4 times per week. A vitamin D supplement OTC may help with symptoms.  IBS: Try avoiding FODMAPs to see if this helps with your symptoms.    Irritable Bowel Syndrome Irritable bowel syndrome (IBS) is caused by a disturbance of normal bowel function and is a common digestive disorder. You may also hear this condition called spastic colon, mucous colitis, and irritable colon. There is no cure for IBS. However, symptoms often gradually improve or disappear with a good diet, stress management, and medicine. This condition usually appears in late adolescence or early adulthood. Women develop it twice as often as men. CAUSES  After food has been digested and absorbed in the small intestine, waste material is moved into the large intestine, or colon. In the colon, water and salts are absorbed from the undigested products coming from the small intestine. The remaining residue, or fecal material, is held for elimination. Under normal circumstances, gentle, rhythmic contractions of the bowel walls push the fecal material along the colon toward the rectum. In IBS, however, these contractions are irregular and poorly coordinated. The fecal material is either retained too long, resulting in constipation, or expelled too soon, producing diarrhea. SIGNS AND SYMPTOMS  The most common symptom of IBS is abdominal pain. It is often in the lower left side of the abdomen, but it may occur anywhere in the abdomen. The pain comes from spasms of the bowel muscles happening too much and from the buildup of gas and fecal material in the colon. This pain:  Can range from sharp abdominal cramps to a dull, continuous ache.  Often worsens soon after eating.  Is often relieved by having  a bowel movement or passing gas. Abdominal pain is usually accompanied by constipation, but it may also produce diarrhea. The diarrhea often occurs right after a meal or upon waking up in the morning. The stools are often soft, watery, and flecked with mucus. Other symptoms of IBS include:  Bloating.  Loss of appetite.  Heartburn.  Backache.  Dull pain in the arms or shoulders.  Nausea.  Burping.  Vomiting.  Gas. IBS may also cause symptoms that are unrelated to the digestive system, such as:  Fatigue.  Headaches.  Anxiety.  Shortness of breath.  Trouble concentrating.  Dizziness. These symptoms tend to come and go. DIAGNOSIS  The symptoms of IBS may seem like symptoms of other, more serious digestive disorders. Your health care provider may want to perform tests to exclude these disorders.  TREATMENT Many medicines are available to help correct bowel function or relieve bowel spasms and abdominal pain. Among the medicines available are:  Laxatives for severe constipation and to help restore normal bowel habits.  Specific antidiarrheal medicines to treat severe or lasting diarrhea.  Antispasmodic agents to relieve intestinal cramps. Your health care provider may also decide to treat you with a mild tranquilizer or sedative during unusually stressful periods in your life. Your health care provider may also prescribe antidepressant medicine. The use of this medicine has been shown to reduce pain and other symptoms of IBS. Remember that if any medicine is prescribed for you, you should take it exactly as directed. Make sure your health care provider knows how well it worked  for you. HOME CARE INSTRUCTIONS   Take all medicines as directed by your health care provider.  Avoid foods that are high in fat or oils, such as heavy cream, butter, frankfurters, sausage, and other fatty meats.  Avoid foods that make you go to the bathroom, such as fruit, fruit juice, and dairy  products.  Cut out carbonated drinks, chewing gum, and "gassy" foods such as beans and cabbage. This may help relieve bloating and burping.  Eat foods with bran, and drink plenty of liquids with the bran foods. This helps relieve constipation.  Keep track of what foods seem to bring on your symptoms.  Avoid emotionally charged situations or circumstances that produce anxiety.  Start or continue exercising.  Get plenty of rest and sleep. Document Released: 04/02/2005 Document Revised: 04/07/2013 Document Reviewed: 11/21/2007 [facility] Patient Information 2015 [place], [place]. This information is not intended to replace advice given to you by your health care provider. Make sure you discuss any questions you have with your health care provider.

## 2014-11-08 NOTE — Assessment & Plan Note (Signed)
Trial of SSRI to help with symptoms. Discussed trial of FODMAP diet. Keep food diary to note triggers. Encouraged daily exercise and stress management.  Can take Bentyl up to 4 times daily as needed.  Consider GI referral if symptoms are refractory.

## 2014-11-08 NOTE — Progress Notes (Signed)
Subjective:    Patient ID: Jacob Andrews, male    DOB: 1986-02-14, 29 y.o.   MRN: [id number]  HPI: Jacob Andrews is a 29 y.o. male presenting on 11/08/2014 for Establish Care   HPI  Pt presents to establish care today. Previous primary care provider was Dr. Ilene Qua in [place].  Pt also was seen by Dr. Jacqlyn Larsen (urology) for kidney stones.  Current medical problems include: IBS/ Lactose Intolerance: Diagnosed a few months ago- by urgent care physician. Pt is taking bentyl and lactaid pills to help Symptoms have improved with the lactose.  Bentyl is somewhat helpful but symptoms quickly return. Pt has bloating and cramping that is relieved after having a BM.  Kidney stones: Recurrent- three times per year. Diagnosed as a child. 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 Has had lithotripsy in the past.  Depression: Underlying dysthmia- never been on medication. No counseling in the past. No SI. Pt reports feeling tired most days of the week and early morning awakenings, little interest or pleasure in doing things.   Exercise:  Job: Merchandiser, retail.   Past Medical History  Diagnosis Date  . Kidney stone   . IBS (irritable bowel syndrome)   . Depression    History   Social History  . Marital Status: Single    Spouse Name: N/A  . Number of Children: N/A  . Years of Education: N/A   Occupational History  . Not on file.   Social History Main Topics  . Smoking status: Never Smoker   . Smokeless tobacco: Not on file  . Alcohol Use: No  . Drug Use: No  . Sexual Activity: Yes     Comment: partner is on the pill.    Other Topics Concern  . Not on file   Social History Narrative  . No narrative on file   Family History  Problem Relation Age of Onset  . Diabetes Father   . Heart disease Maternal Grandmother     heart attack  . Heart disease Paternal Grandfather   . Stroke Paternal Grandfather    No current outpatient prescriptions on file prior to visit.    No current facility-administered medications on file prior to visit.    Review of Systems  Constitutional: Negative for fever and chills.  HENT: Negative.   Respiratory: Negative for chest tightness, shortness of breath and wheezing.   Cardiovascular: Negative for chest pain, palpitations and leg swelling.  Gastrointestinal: Positive for abdominal pain, diarrhea and constipation. Negative for nausea and vomiting.  Endocrine: Negative.   Genitourinary: Negative for dysuria, urgency, discharge, penile pain and testicular pain.  Musculoskeletal: Negative for back pain, joint swelling and arthralgias.  Skin: Negative.   Neurological: Negative for dizziness, weakness, numbness and headaches.  Psychiatric/Behavioral: Positive for sleep disturbance and dysphoric mood. Negative for suicidal ideas, confusion and agitation.   Per HPI unless specifically indicated above     Objective:    BP 131/77 mmHg  Pulse 73  Temp(Src) 98.2 F (36.8 C) (Oral)  Resp 16  Ht 5\' 10"  (1.778 m)  Wt 229 lb (103.874 kg)  BMI 32.86 kg/m2  Wt Readings from Last 3 Encounters:  11/08/14 229 lb (103.874 kg)    Physical Exam  Constitutional: He is oriented to person, place, and time. He appears well-developed and well-nourished. No distress.  Cardiovascular: Normal rate and regular rhythm.  Exam reveals no gallop and no friction rub.   No murmur heard. Pulmonary/Chest:  Effort normal and breath sounds normal. He has no wheezes. He exhibits no tenderness.  Abdominal: Soft. Bowel sounds are normal. He exhibits no distension. There is no tenderness.  Musculoskeletal: Normal range of motion. He exhibits no edema or tenderness.  Neurological: He is alert and oriented to person, place, and time. No cranial nerve deficit.  Skin: Skin is warm and dry. He is not diaphoretic.   Depression screen PHQ 2/9 11/08/2014  Decreased Interest 2  Down, Depressed, Hopeless 1  PHQ - 2 Score 3  Altered sleeping 2  Tired,  decreased energy 3  Change in appetite 2  Feeling bad or failure about yourself  1  Trouble concentrating 0  Moving slowly or fidgety/restless 0  Suicidal thoughts 0  PHQ-9 Score 11  Difficult doing work/chores Somewhat difficult        Assessment & Plan:   Problem List Items Addressed This Visit      Digestive   Irritable bowel syndrome (IBS)    Trial of SSRI to help with symptoms. Discussed trial of FODMAP diet. Keep food diary to note triggers. Encouraged daily exercise and stress management.  Can take Bentyl up to 4 times daily as needed.  Consider GI referral if symptoms are refractory.       Relevant Medications   dicyclomine (BENTYL) 20 MG tablet   Other Relevant Orders   Comprehensive Metabolic Panel (CMET)   Lactose intolerance in adult    Symptoms controlled with avoidance of dairy.         Other   Depression    Pt would like to start treatment today. Zoloft 50 mg daily. Side effects reviewed. Encouraged 30 minutes of exercise daily.  Check TSH and vitamin D.  Encouraged OTC vitamin D to help with energy levels.   RTC 3 weeks for medication efficacy check.       Relevant Medications   sertraline (ZOLOFT) 50 MG tablet   Cholecalciferol 2000 UNITS CAPS   Other Relevant Orders   Vit D  25 hydroxy (rtn osteoporosis monitoring)   TSH    Other Visit Diagnoses    Encounter to establish care    -  Primary    Family history of heart disease        Baseline lipid panel.    Relevant Orders    Lipid Profile       Meds ordered this encounter  Medications  . DISCONTD: dicyclomine (BENTYL) 20 MG tablet    Sig: TK 1 T PO  TID PRN    Refill:  2  . sertraline (ZOLOFT) 50 MG tablet    Sig: Take 1 tablet (50 mg total) by mouth daily. Break first 2 tabs in 1/2 and take 1/2 pill for first 4 days and then resume full dose on day 5.    Dispense:  30 tablet    Refill:  11    Order Specific Question:  Supervising Provider    Answer:  Arlis Porta [phone]  .  Cholecalciferol 2000 UNITS CAPS    Sig: Take 1 capsule (2,000 Units total) by mouth daily.    Dispense:  30 each    Order Specific Question:  Supervising Provider    Answer:  Arlis Porta [phone]  . dicyclomine (BENTYL) 20 MG tablet    Sig: Take 1 tablet (20 mg total) by mouth 4 (four) times daily -  before meals and at bedtime.    Dispense:  120 tablet    Refill:  11    Order Specific Question:  Supervising Provider    Answer:  Arlis Porta [[id number]]      Follow up plan: Return in about 3 weeks (around 11/29/2014) for IBS and depression.

## 2014-11-29 ENCOUNTER — Encounter: Payer: Self-pay | Admitting: Family Medicine

## 2014-11-29 ENCOUNTER — Ambulatory Visit (INDEPENDENT_AMBULATORY_CARE_PROVIDER_SITE_OTHER): Payer: 59 | Admitting: Family Medicine

## 2014-11-29 VITALS — BP 140/83 | HR 88 | Temp 98.0°F | Resp 16 | Ht 70.0 in | Wt 229.0 lb

## 2014-11-29 DIAGNOSIS — G4733 Obstructive sleep apnea (adult) (pediatric): Secondary | ICD-10-CM

## 2014-11-29 DIAGNOSIS — F329 Major depressive disorder, single episode, unspecified: Secondary | ICD-10-CM

## 2014-11-29 DIAGNOSIS — F32A Depression, unspecified: Secondary | ICD-10-CM

## 2014-11-29 DIAGNOSIS — G4730 Sleep apnea, unspecified: Secondary | ICD-10-CM

## 2014-11-29 MED ORDER — SERTRALINE HCL 100 MG PO TABS: 100.0000 mg | ORAL_TABLET | Freq: Every day | ORAL | Status: DC

## 2014-11-29 NOTE — Assessment & Plan Note (Signed)
Increase Zoloft to 100mg  daily. Family has noticed he is doing well, but pt doesn't feel much a difference. Improvement of PHQ 9 from 11/27 to 7/27.  If pt unable to tolerate side effects at 100mg , reduce dose to 50 mg daily.  RTC 1 mos to determine efficacy of dosing.

## 2014-11-29 NOTE — Addendum Note (Signed)
Addended by: Devona Konig on: 11/29/2014 02:35 PM   Modules accepted: Orders

## 2014-11-29 NOTE — Patient Instructions (Signed)
Sleep Apnea  Sleep apnea is a sleep disorder characterized by abnormal pauses in breathing while you sleep. When your breathing pauses, the level of oxygen in your blood decreases. This causes you to move out of deep sleep and into light sleep. As a result, your quality of sleep is poor, and the system that carries your blood throughout your body (cardiovascular system) experiences stress. If sleep apnea remains untreated, the following conditions can develop:  High blood pressure (hypertension).  Coronary artery disease.  Inability to achieve or maintain an erection (impotence).  Impairment of your thought process (cognitive dysfunction). There are three types of sleep apnea: 1. Obstructive sleep apnea--Pauses in breathing during sleep because of a blocked airway. 2. Central sleep apnea--Pauses in breathing during sleep because the area of the brain that controls your breathing does not send the correct signals to the muscles that control breathing. 3. Mixed sleep apnea--A combination of both obstructive and central sleep apnea. RISK FACTORS The following risk factors can increase your risk of developing sleep apnea:  Being overweight.  Smoking.  Having narrow passages in your nose and throat.  Being of older age.  Being male.  Alcohol use.  Sedative and tranquilizer use.  Ethnicity. Among individuals younger than 35 years, African Americans are at increased risk of sleep apnea. SYMPTOMS   Difficulty staying asleep.  Daytime sleepiness and fatigue.  Loss of energy.  Irritability.  Loud, heavy snoring.  Morning headaches.  Trouble concentrating.  Forgetfulness.  Decreased interest in sex. DIAGNOSIS  In order to diagnose sleep apnea, your caregiver will perform a physical examination. Your caregiver may suggest that you take a home sleep test. Your caregiver may also recommend that you spend the night in a sleep lab. In the sleep lab, several monitors record  information about your heart, lungs, and brain while you sleep. Your leg and arm movements and blood oxygen level are also recorded. TREATMENT The following actions may help to resolve mild sleep apnea:  Sleeping on your side.   Using a decongestant if you have nasal congestion.   Avoiding the use of depressants, including alcohol, sedatives, and narcotics.   Losing weight and modifying your diet if you are overweight. There also are devices and treatments to help open your airway:  Oral appliances. These are custom-made mouthpieces that shift your lower jaw forward and slightly open your bite. This opens your airway.  Devices that create positive airway pressure. This positive pressure "splints" your airway open to help you breathe better during sleep. The following devices create positive airway pressure:  Continuous positive airway pressure (CPAP) device. The CPAP device creates a continuous level of air pressure with an air pump. The air is delivered to your airway through a mask while you sleep. This continuous pressure keeps your airway open.  Nasal expiratory positive airway pressure (EPAP) device. The EPAP device creates positive air pressure as you exhale. The device consists of single-use valves, which are inserted into each nostril and held in place by adhesive. The valves create very little resistance when you inhale but create much more resistance when you exhale. That increased resistance creates the positive airway pressure. This positive pressure while you exhale keeps your airway open, making it easier to breath when you inhale again.  Bilevel positive airway pressure (BPAP) device. The BPAP device is used mainly in patients with central sleep apnea. This device is similar to the CPAP device because it also uses an air pump to deliver continuous air pressure   through a mask. However, with the BPAP machine, the pressure is set at two different levels. The pressure when you  exhale is lower than the pressure when you inhale.  Surgery. Typically, surgery is only done if you cannot comply with less invasive treatments or if the less invasive treatments do not improve your condition. Surgery involves removing excess tissue in your airway to create a wider passage way. Document Released: 03/23/2002 Document Revised: 07/28/2012 Document Reviewed: 08/09/2011 [employer] Patient Information 2015 [employer]. This information is not intended to replace advice given to you by your health care provider. Make sure you discuss any questions you have with your health care provider.  

## 2014-11-29 NOTE — Assessment & Plan Note (Signed)
STOP Bang score 5/8- high risk for sleep apnea. Witnessed apnea at home. Will refer to sleep study at [facility] to determine if candidate for CPAP.  If unable to do so 2/2 insurance- plan to refer to ENT for snoring, consider new dental device to help with snoring/sleep apnea.

## 2014-11-29 NOTE — Progress Notes (Signed)
Subjective:    Patient ID: Jacob Andrews, male    DOB: 14-Jan-1986, 29 y.o.   MRN: [id number]  HPI: Jacob Andrews is a 29 y.o. male presenting on 11/29/2014 for Depression   HPI  Pt presents to follow-up on depression. He also has questions about sleep apnea.  He thinks that may be contributing to his fatigue. Both parents have sleep apnea. He has witnessed episodes of low O2 during sleep and hospitalization. His g/f also reports witnessed apnea. Risk factors include male gender, neck circumference >40 cm (41cm).   STOP BANG score is 5/8- high risk for sleep apnea.   Depression: Pt reports he hasn't noticed too much of a difference in mood since starting medication but girlfirend reports he is doing better. He doesn't feel he has had as many bad day. Pt recently got a puppy- feels it is making his mood better. He would like to increase to 100 mg today. Denies SI/HI. Major symptoms include fatigue, over eating, little interest or pleasure in doing things.   Past Medical History  Diagnosis Date  . Kidney stone   . IBS (irritable bowel syndrome)   . Depression     Current Outpatient Prescriptions on File Prior to Visit  Medication Sig  . Cholecalciferol 2000 UNITS CAPS Take 1 capsule (2,000 Units total) by mouth daily.  Marland Kitchen dicyclomine (BENTYL) 20 MG tablet Take 1 tablet (20 mg total) by mouth 4 (four) times daily -  before meals and at bedtime.   No current facility-administered medications on file prior to visit.    Review of Systems  Constitutional: Positive for fatigue. Negative for fever and chills.  HENT: Negative.   Respiratory: Negative for chest tightness, shortness of breath and wheezing.   Cardiovascular: Negative for chest pain, palpitations and leg swelling.  Gastrointestinal: Negative for nausea, vomiting and abdominal pain.  Musculoskeletal: Negative.   Neurological: Negative for dizziness, syncope, weakness and light-headedness.  Psychiatric/Behavioral: Positive for  sleep disturbance and dysphoric mood. Negative for suicidal ideas, behavioral problems and confusion. The patient is not nervous/anxious.    Per HPI unless specifically indicated above     Objective:    BP 140/83 mmHg  Pulse 88  Temp(Src) 98 F (36.7 C)  Resp 16  Ht 5\' 10"  (1.778 m)  Wt 229 lb (103.874 kg)  BMI 32.86 kg/m2  Wt Readings from Last 3 Encounters:  11/29/14 229 lb (103.874 kg)  11/08/14 229 lb (103.874 kg)    Depression screen [facility] 2/9 11/29/2014 11/08/2014  Decreased Interest 1 2  Down, Depressed, Hopeless 1 1  PHQ - 2 Score 2 3  Altered sleeping 2 2  Tired, decreased energy 1 3  Change in appetite 1 2  Feeling bad or failure about yourself  0 1  Trouble concentrating 0 0  Moving slowly or fidgety/restless 1 0  Suicidal thoughts 0 0  PHQ-9 Score 7 11  Difficult doing work/chores Somewhat difficult Somewhat difficult   - Physical Exam  Constitutional: He is oriented to person, place, and time. He appears well-developed and well-nourished. No distress.  HENT:  Head: Normocephalic and atraumatic.  Neck: Normal range of motion. Neck supple. No thyromegaly present.  Cardiovascular: Normal rate, regular rhythm and normal heart sounds.  Exam reveals no gallop and no friction rub.   No murmur heard. Pulmonary/Chest: Effort normal and breath sounds normal. He has no wheezes.  Musculoskeletal: Normal range of motion. He exhibits no edema or tenderness.  Neurological: He is alert and  oriented to person, place, and time. He has normal reflexes.  Skin: Skin is warm and dry. No rash noted. No erythema.  Psychiatric: He has a normal mood and affect. His speech is normal and behavior is normal. Judgment and thought content normal. Cognition and memory are normal.       Assessment & Plan:   Problem List Items Addressed This Visit      Other   Depression - Primary    Increase Zoloft to 100mg  daily. Family has noticed he is doing well, but pt doesn't feel much a  difference. Improvement of PHQ 9 from 11/27 to 7/27.  If pt unable to tolerate side effects at 100mg , reduce dose to 50 mg daily.  RTC 1 mos to determine efficacy of dosing.       Relevant Medications   sertraline (ZOLOFT) 100 MG tablet   Sleep apnea in adult    STOP Bang score 5/8- high risk for sleep apnea. Witnessed apnea at home. Will refer to sleep study at [facility] to determine if candidate for CPAP.  If unable to do so 2/2 insurance- plan to refer to ENT for snoring, consider new dental device to help with snoring/sleep apnea.       Relevant Orders   Ambulatory referral to Sleep Studies      Meds ordered this encounter  Medications  . sertraline (ZOLOFT) 100 MG tablet    Sig: Take 1 tablet (100 mg total) by mouth daily.    Dispense:  30 tablet    Refill:  11    Order Specific Question:  Supervising Provider    Answer:  Arlis Porta [phone]      Follow up plan: Return in about 1 month (around 12/30/2014).

## 2014-12-23 ENCOUNTER — Telehealth: Payer: Self-pay | Admitting: Family Medicine

## 2014-12-23 NOTE — Telephone Encounter (Signed)
Kennyth Lose at [facility] said Amy had ordered an overnight sleep study for pt but his insurance denied it.  They study is scheduled for tomorrow night.  They said a peer to peer could be done to challenge the denial.  The only other option is a home study.  Please call Kennyth Lose at [phone]

## 2014-12-23 NOTE — Telephone Encounter (Signed)
Since insurance denied, cancel the overnight study and schedule a home study with [facility],  i would have a hard time with peer to peer discussion since I have never seen patient,0jh

## 2014-12-23 NOTE — Telephone Encounter (Signed)
It was send to [place].

## 2014-12-23 NOTE — Telephone Encounter (Signed)
Please suggest? 

## 2014-12-24 ENCOUNTER — Other Ambulatory Visit: Payer: Self-pay | Admitting: Family Medicine

## 2014-12-24 DIAGNOSIS — G4730 Sleep apnea, unspecified: Secondary | ICD-10-CM

## 2015-02-07 ENCOUNTER — Encounter: Payer: Self-pay | Admitting: Family Medicine

## 2015-02-07 ENCOUNTER — Ambulatory Visit (INDEPENDENT_AMBULATORY_CARE_PROVIDER_SITE_OTHER): Payer: 59 | Admitting: Family Medicine

## 2015-02-07 VITALS — BP 133/82 | HR 87 | Resp 16 | Ht 70.0 in | Wt 226.6 lb

## 2015-02-07 DIAGNOSIS — K589 Irritable bowel syndrome without diarrhea: Secondary | ICD-10-CM

## 2015-02-07 DIAGNOSIS — Z23 Encounter for immunization: Secondary | ICD-10-CM

## 2015-02-07 DIAGNOSIS — F329 Major depressive disorder, single episode, unspecified: Secondary | ICD-10-CM | POA: Diagnosis not present

## 2015-02-07 DIAGNOSIS — F32A Depression, unspecified: Secondary | ICD-10-CM

## 2015-02-07 MED ORDER — ESCITALOPRAM OXALATE 10 MG PO TABS: 10.0000 mg | ORAL_TABLET | Freq: Every day | ORAL | Status: DC

## 2015-02-07 NOTE — Progress Notes (Signed)
Name: Jacob Andrews   MRN: [id number]    DOB: Aug 31, 1985   Date:02/07/2015       Progress Note  Subjective  Chief Complaint  Chief Complaint  Patient presents with  . Depression    HPI Here fort f/u of depression and IBS.  Depression is doing well, but c/o excessive sweating. IBS is no better.  Has to take Bentyl periodically.  No problem-specific assessment & plan notes found for this encounter.   Past Medical History  Diagnosis Date  . Kidney stone   . IBS (irritable bowel syndrome)   . Depression     Social History  Substance Use Topics  . Smoking status: Never Smoker   . Smokeless tobacco: Not on file  . Alcohol Use: No     Current outpatient prescriptions:  .  Cholecalciferol 2000 UNITS CAPS, Take 1 capsule (2,000 Units total) by mouth daily., Disp: 30 each, Rfl:  .  dicyclomine (BENTYL) 20 MG tablet, Take 1 tablet (20 mg total) by mouth 4 (four) times daily -  before meals and at bedtime., Disp: 120 tablet, Rfl: 11 .  escitalopram (LEXAPRO) 10 MG tablet, Take 1 tablet (10 mg total) by mouth daily. Start after stopping Zoloft, Disp: 30 tablet, Rfl: 3  Allergies  Allergen Reactions  . Oxycodone Other (See Comments)    headache    Review of Systems  Constitutional: Negative for fever, chills, weight loss and malaise/fatigue.  HENT: Negative for hearing loss.   Eyes: Negative for blurred vision and double vision.  Respiratory: Negative for cough, shortness of breath and wheezing.   Cardiovascular: Negative for chest pain, palpitations and leg swelling.  Gastrointestinal: Positive for heartburn (occ.), abdominal pain (intermittamtly) and diarrhea (intermittantly). Negative for blood in stool.  Genitourinary: Negative for dysuria, urgency and frequency.  Skin: Negative for rash.  Neurological: Negative for weakness and headaches.  Psychiatric/Behavioral: Positive for depression (improved). The patient is not nervous/anxious and does not have insomnia.        Objective  Filed Vitals:   02/07/15 0854  BP: 133/82  Pulse: 87  Resp: 16  Height: 5\' 10"  (1.778 m)  Weight: 226 lb 9.6 oz (102.785 kg)     Physical Exam  Constitutional: He is oriented to person, place, and time and well-developed, well-nourished, and in no distress. No distress.  HENT:  Head: Normocephalic and atraumatic.  Eyes: Conjunctivae and EOM are normal. Pupils are equal, round, and reactive to light. No scleral icterus.  Neck: Normal range of motion. Neck supple. Carotid bruit is not present. No thyromegaly present.  Cardiovascular: Normal rate, regular rhythm, normal heart sounds and intact distal pulses.  Exam reveals no gallop and no friction rub.   No murmur heard. Pulmonary/Chest: Effort normal and breath sounds normal. No respiratory distress. He has no wheezes. He exhibits no tenderness.  Abdominal: Bowel sounds are normal. He exhibits no distension and no mass. There is no tenderness.  Musculoskeletal: Normal range of motion. He exhibits no edema.  Lymphadenopathy:    He has no cervical adenopathy.  Neurological: He is alert and oriented to person, place, and time.  Vitals reviewed.     No results found for this or any previous visit (from the past 2160 hour(s)).   Assessment & Plan  1. Need for influenza vaccination  - Flu Vaccine QUAD 36+ mos PF IM (Fluarix & Fluzone Quad PF)  2. Depression  - escitalopram (LEXAPRO) 10 MG tablet; Take 1 tablet (10 mg total) by mouth daily.  Start after stopping Zoloft  Dispense: 30 tablet; Refill: 3  3. Irritable bowel syndrome (IBS)  - escitalopram (LEXAPRO) 10 MG tablet; Take 1 tablet (10 mg total) by mouth daily. Start after stopping Zoloft  Dispense: 30 tablet; Refill: 3

## 2015-02-07 NOTE — Patient Instructions (Signed)
Taper off Zoloft- 1/2 tab daioly x 1 week; 1/4 tablet daily for 1 week; 1/4 tab every other day for 1 week.   Then start Lexapro.Jacob Andrews

## 2015-02-14 ENCOUNTER — Telehealth: Payer: Self-pay | Admitting: *Deleted

## 2015-02-14 NOTE — Telephone Encounter (Signed)
Ok-jh 

## 2015-02-14 NOTE — Telephone Encounter (Signed)
FYI: Patient rescheduled sleep study to 02/21/15 @ 4 pm.

## 2015-02-28 ENCOUNTER — Other Ambulatory Visit: Payer: Self-pay | Admitting: Family Medicine

## 2015-03-01 ENCOUNTER — Other Ambulatory Visit: Payer: Self-pay | Admitting: Family Medicine

## 2015-03-01 DIAGNOSIS — G4730 Sleep apnea, unspecified: Secondary | ICD-10-CM

## 2015-03-01 DIAGNOSIS — G4733 Obstructive sleep apnea (adult) (pediatric): Secondary | ICD-10-CM

## 2015-05-23 ENCOUNTER — Ambulatory Visit (INDEPENDENT_AMBULATORY_CARE_PROVIDER_SITE_OTHER): Payer: 59 | Admitting: Family Medicine

## 2015-05-23 ENCOUNTER — Encounter: Payer: Self-pay | Admitting: Family Medicine

## 2015-05-23 VITALS — BP 140/82 | HR 77 | Temp 98.4°F | Resp 16 | Ht 70.0 in | Wt 248.0 lb

## 2015-05-23 DIAGNOSIS — F329 Major depressive disorder, single episode, unspecified: Secondary | ICD-10-CM

## 2015-05-23 DIAGNOSIS — G4730 Sleep apnea, unspecified: Secondary | ICD-10-CM

## 2015-05-23 DIAGNOSIS — G4733 Obstructive sleep apnea (adult) (pediatric): Secondary | ICD-10-CM | POA: Diagnosis not present

## 2015-05-23 DIAGNOSIS — K589 Irritable bowel syndrome without diarrhea: Secondary | ICD-10-CM

## 2015-05-23 DIAGNOSIS — F32A Depression, unspecified: Secondary | ICD-10-CM

## 2015-05-23 MED ORDER — VENLAFAXINE HCL ER 37.5 MG PO CP24: 75.0000 mg | ORAL_CAPSULE | Freq: Every day | ORAL | Status: DC

## 2015-05-23 NOTE — Progress Notes (Signed)
Subjective:    Patient ID: Jacob Andrews, male    DOB: 1985/07/17, 30 y.o.   MRN: [id number]  HPI: Jacob Andrews is a 30 y.o. male presenting on 05/23/2015 for Depression   HPI  Pt presents for depression follow-up. Depression is doing well but lexapro is causing sexual side effects. PHQ-9 is 10 today. He did well on zoloft as far as sexual side effects but was not covering his depression. Sex drive is low and trouble obtaining orgasm.   Sleep study: Did home sleep study. Was told to get a CPAP trial but never heard anything else. Still snoring loudly. Lots of daytime sleepiness.   IBS: Symptoms doing well.   Past Medical History  Diagnosis Date  . Kidney stone   . IBS (irritable bowel syndrome)   . Depression     Current Outpatient Prescriptions on File Prior to Visit  Medication Sig  . Cholecalciferol 2000 UNITS CAPS Take 1 capsule (2,000 Units total) by mouth daily.  Marland Kitchen dicyclomine (BENTYL) 20 MG tablet Take 1 tablet (20 mg total) by mouth 4 (four) times daily -  before meals and at bedtime.   No current facility-administered medications on file prior to visit.    Review of Systems  Constitutional: Positive for fatigue. Negative for fever and chills.  HENT: Negative.   Respiratory: Negative for chest tightness, shortness of breath and wheezing.   Cardiovascular: Negative for chest pain, palpitations and leg swelling.  Gastrointestinal: Negative for nausea, vomiting and abdominal pain.  Endocrine: Negative.   Genitourinary: Negative for dysuria, urgency, discharge, penile pain and testicular pain.  Musculoskeletal: Negative for back pain, joint swelling and arthralgias.  Skin: Negative.   Neurological: Negative for dizziness, weakness, numbness and headaches.  Psychiatric/Behavioral: Positive for sleep disturbance and dysphoric mood.   Per HPI unless specifically indicated above     Objective:    BP 140/82 mmHg  Pulse 77  Temp(Src) 98.4 F (36.9 C) (Oral)  Resp  16  Ht 5\' 10"  (1.778 m)  Wt 248 lb (112.492 kg)  BMI 35.58 kg/m2  Wt Readings from Last 3 Encounters:  05/23/15 248 lb (112.492 kg)  02/07/15 226 lb 9.6 oz (102.785 kg)  11/29/14 229 lb (103.874 kg)    Physical Exam  Constitutional: He is oriented to person, place, and time. He appears well-developed and well-nourished. No distress.  HENT:  Head: Normocephalic and atraumatic.  Neck: Neck supple. No thyromegaly present.  Cardiovascular: Normal rate, regular rhythm and normal heart sounds.  Exam reveals no gallop and no friction rub.   No murmur heard. Pulmonary/Chest: Effort normal and breath sounds normal. He has no wheezes.  Abdominal: Soft. Bowel sounds are normal. He exhibits no distension. There is no tenderness. There is no rebound.  Musculoskeletal: Normal range of motion. He exhibits no edema or tenderness.  Neurological: He is alert and oriented to person, place, and time. He has normal reflexes.  Skin: Skin is warm and dry. No rash noted. No erythema.  Psychiatric: He has a normal mood and affect. His speech is normal and behavior is normal. Judgment and thought content normal. Cognition and memory are normal.       Assessment & Plan:   Problem List Items Addressed This Visit      Digestive   Irritable bowel syndrome (IBS)    Controlled.         Other   Depression - Primary    Switch to Effexor to see if side effects can  be improve. 1 week x 37.5mg  dose and then increase to full dosing.  Recheck 1 mos.       Relevant Medications   venlafaxine XR (EFFEXOR-XR) 37.5 MG 24 hr capsule   Sleep apnea in adult    Home sleep study was positive for sleep apnea. Will contact [facility] to determine if that patient needs a formal sleep study or can be fitted with a CPAP machine.          Meds ordered this encounter  Medications  . venlafaxine XR (EFFEXOR-XR) 37.5 MG 24 hr capsule    Sig: Take 2 capsules (75 mg total) by mouth daily with breakfast.    Dispense:  60  capsule    Refill:  11    Order Specific Question:  Supervising Provider    Answer:  Arlis Porta [phone]      Follow up plan: Return in about 4 weeks (around 06/20/2015) for depression.Marland Kitchen

## 2015-05-23 NOTE — Assessment & Plan Note (Signed)
Home sleep study was positive for sleep apnea. Will contact [facility] to determine if that patient needs a formal sleep study or can be fitted with a CPAP machine.

## 2015-05-23 NOTE — Assessment & Plan Note (Signed)
Switch to Effexor to see if side effects can be improve. 1 week x 37.5mg  dose and then increase to full dosing.  Recheck 1 mos.

## 2015-05-23 NOTE — Patient Instructions (Addendum)
Let's try switching to Venlafaxine to see if that helps the side effects.   We will figure out what happened with your sleep study.   Venlafaxine extended-release capsules What is this medicine? VENLAFAXINE(VEN la fax een) is used to treat depression, anxiety and panic disorder. This medicine may be used for other purposes; ask your health care provider or pharmacist if you have questions. What should I tell my health care provider before I take this medicine? They need to know if you have any of these conditions: -bleeding disorders -glaucoma -heart disease -high blood pressure -high cholesterol -kidney disease -liver disease -low levels of sodium in the blood -mania or bipolar disorder -seizures -suicidal thoughts, plans, or attempt; a previous suicide attempt by you or a family -take medicines that treat or prevent blood clots -thyroid disease -an unusual or allergic reaction to venlafaxine, desvenlafaxine, other medicines, foods, dyes, or preservatives -pregnant or trying to get pregnant -breast-feeding How should I use this medicine? Take this medicine by mouth with a full glass of water. Follow the directions on the prescription label. Do not cut, crush, or chew this medicine. Take it with food. If needed, the capsule may be carefully opened and the entire contents sprinkled on a spoonful of cool applesauce. Swallow the applesauce/pellet mixture right away without chewing and follow with a glass of water to ensure complete swallowing of the pellets. Try to take your medicine at about the same time each day. Do not take your medicine more often than directed. Do not stop taking this medicine suddenly except upon the advice of your doctor. Stopping this medicine too quickly may cause serious side effects or your condition may worsen. A special MedGuide will be given to you by the pharmacist with each prescription and refill. Be sure to read this information carefully each time. Talk  to your pediatrician regarding the use of this medicine in children. Special care may be needed. Overdosage: If you think you have taken too much of this medicine contact a poison control center or emergency room at once. NOTE: This medicine is only for you. Do not share this medicine with others. What if I miss a dose? If you miss a dose, take it as soon as you can. If it is almost time for your next dose, take only that dose. Do not take double or extra doses. What may interact with this medicine? Do not take this medicine with any of the following medications: -certain medicines for fungal infections like fluconazole, itraconazole, ketoconazole, posaconazole, voriconazole -cisapride -desvenlafaxine -dofetilide -dronedarone -duloxetine -levomilnacipran -linezolid -MAOIs like Carbex, Eldepryl, Marplan, Nardil, and Parnate -methylene blue (injected into a vein) -milnacipran -pimozide -thioridazine -ziprasidone This medicine may also interact with the following medications: -aspirin and aspirin-like medicines -certain medicines for depression, anxiety, or psychotic disturbances -certain medicines for migraine headaches like almotriptan, eletriptan, frovatriptan, naratriptan, rizatriptan, sumatriptan, zolmitriptan -certain medicines for sleep -certain medicines that treat or prevent blood clots like dalteparin, enoxaparin, warfarin -cimetidine -clozapine -diuretics -fentanyl -furazolidone -indinavir -isoniazid -lithium -metoprolol -NSAIDS, medicines for pain and inflammation, like ibuprofen or naproxen -other medicines that prolong the QT interval (cause an abnormal heart rhythm) -procarbazine -rasagiline -supplements like St. John's wort, kava kava, valerian -tramadol -tryptophan This list may not describe all possible interactions. Give your health care provider a list of all the medicines, herbs, non-prescription drugs, or dietary supplements you use. Also tell them if you  smoke, drink alcohol, or use illegal drugs. Some items may interact with your medicine. What should  I watch for while using this medicine? Tell your doctor if your symptoms do not get better or if they get worse. Visit your doctor or health care professional for regular checks on your progress. Because it may take several weeks to see the full effects of this medicine, it is important to continue your treatment as prescribed by your doctor. Patients and their families should watch out for new or worsening thoughts of suicide or depression. Also watch out for sudden changes in feelings such as feeling anxious, agitated, panicky, irritable, hostile, aggressive, impulsive, severely restless, overly excited and hyperactive, or not being able to sleep. If this happens, especially at the beginning of treatment or after a change in dose, call your health care professional. This medicine can cause an increase in blood pressure. Check with your doctor for instructions on monitoring your blood pressure while taking this medicine. You may get drowsy or dizzy. Do not drive, use machinery, or do anything that needs mental alertness until you know how this medicine affects you. Do not stand or sit up quickly, especially if you are an older patient. This reduces the risk of dizzy or fainting spells. Alcohol may interfere with the effect of this medicine. Avoid alcoholic drinks. Your mouth may get dry. Chewing sugarless gum, sucking hard candy and drinking plenty of water will help. Contact your doctor if the problem does not go away or is severe. What side effects may I notice from receiving this medicine? Side effects that you should report to your doctor or health care professional as soon as possible: -allergic reactions like skin rash, itching or hives, swelling of the face, lips, or tongue -breathing problems -changes in vision -hallucination, loss of contact with reality -seizures -suicidal thoughts or other  mood changes -trouble passing urine or change in the amount of urine -unusual bleeding or bruising Side effects that usually do not require medical attention (report to your doctor or health care professional if they continue or are bothersome): -change in sex drive or performance -constipation -increased sweating -loss of appetite -nausea -tremors -weight loss This list may not describe all possible side effects. Call your doctor for medical advice about side effects. You may report side effects to FDA at 1-800-FDA-1088. Where should I keep my medicine? Keep out of the reach of children. Store at a controlled temperature between 20 and 25 degrees C (68 degrees and 77 degrees F), in a dry place. Throw away any unused medicine after the expiration date. NOTE: This sheet is a summary. It may not cover all possible information. If you have questions about this medicine, talk to your doctor, pharmacist, or health care provider.    2016, Elsevier/Gold Standard. (2012-10-28 12:46:03)

## 2015-05-23 NOTE — Assessment & Plan Note (Signed)
Controlled.  

## 2015-06-02 ENCOUNTER — Telehealth: Payer: Self-pay | Admitting: Family Medicine

## 2015-06-02 NOTE — Telephone Encounter (Signed)
Called pt and LMTCB regarding CPAP. Please see below.

## 2015-06-02 NOTE — Telephone Encounter (Signed)
-----   Message from Berton Mount sent at 06/02/2015 10:31 AM EST ----- Regarding: RE: How to get a CPAP machine Just FYI  We have tried to reach out to this patient multiple times and he is not returning our phone calls?  Can someone at the doctor's office contact him and see if he is indeed ready to proceed with cpap?  Thanks so much!  Mandy at [facility] ----- Message -----    From: Luciana Axe, NP    Sent: 05/23/2015   8:45 AM      To: Migdalia Dk Wooten, Nishaben Morrie [place], [place] Subject: How to get a CPAP machine                      Hi Mandy!   This patient had a home sleep study in November that showed sleep apnea. It suggested a trial of CPAP. The patient said he went to the place where he rented the sleep study machine from in November but never heard back about getting CPAP machine. Is a home sleep study positive for sleep apnea enough for him to qualify for home CPAP?  Could we look into getting him set up with a CPAP at home?  Thanks! AK

## 2015-06-03 NOTE — Telephone Encounter (Signed)
2nd message left today in regards to CPAP machine.

## 2015-12-05 ENCOUNTER — Encounter: Payer: Self-pay | Admitting: Family Medicine

## 2015-12-05 ENCOUNTER — Ambulatory Visit (INDEPENDENT_AMBULATORY_CARE_PROVIDER_SITE_OTHER): Payer: 59 | Admitting: Family Medicine

## 2015-12-05 VITALS — BP 142/87 | HR 70 | Temp 98.5°F | Resp 16 | Ht 70.0 in | Wt 234.0 lb

## 2015-12-05 DIAGNOSIS — F32A Depression, unspecified: Secondary | ICD-10-CM

## 2015-12-05 DIAGNOSIS — R42 Dizziness and giddiness: Secondary | ICD-10-CM

## 2015-12-05 DIAGNOSIS — F329 Major depressive disorder, single episode, unspecified: Secondary | ICD-10-CM

## 2015-12-05 DIAGNOSIS — H6592 Unspecified nonsuppurative otitis media, left ear: Secondary | ICD-10-CM | POA: Diagnosis not present

## 2015-12-05 LAB — CBC WITH DIFFERENTIAL/PLATELET
BASOS ABS: 0 {cells}/uL (ref 0–200)
Basophils Relative: 0 %
EOS ABS: 340 {cells}/uL (ref 15–500)
Eosinophils Relative: 5 %
HCT: 44.1 % (ref 38.5–50.0)
Hemoglobin: 15.2 g/dL (ref 13.2–17.1)
LYMPHS PCT: 38 %
Lymphs Abs: 2584 cells/uL (ref 850–3900)
MCH: 31.1 pg (ref 27.0–33.0)
MCHC: 34.5 g/dL (ref 32.0–36.0)
MCV: 90.2 fL (ref 80.0–100.0)
MONOS PCT: 5 %
MPV: 10.2 fL (ref 7.5–12.5)
Monocytes Absolute: 340 cells/uL (ref 200–950)
NEUTROS ABS: 3536 {cells}/uL (ref 1500–7800)
Neutrophils Relative %: 52 %
PLATELETS: 233 10*3/uL (ref 140–400)
RBC: 4.89 MIL/uL (ref 4.20–5.80)
RDW: 13.2 % (ref 11.0–15.0)
WBC: 6.8 10*3/uL (ref 3.8–10.8)

## 2015-12-05 LAB — COMPLETE METABOLIC PANEL WITH GFR
ALBUMIN: 4.5 g/dL (ref 3.6–5.1)
ALK PHOS: 52 U/L (ref 40–115)
ALT: 17 U/L (ref 9–46)
AST: 14 U/L (ref 10–40)
BILIRUBIN TOTAL: 0.3 mg/dL (ref 0.2–1.2)
BUN: 13 mg/dL (ref 7–25)
CO2: 24 mmol/L (ref 20–31)
CREATININE: 0.86 mg/dL (ref 0.60–1.35)
Calcium: 9.3 mg/dL (ref 8.6–10.3)
Chloride: 105 mmol/L (ref 98–110)
GLUCOSE: 92 mg/dL (ref 65–99)
Potassium: 4.2 mmol/L (ref 3.5–5.3)
SODIUM: 140 mmol/L (ref 135–146)
TOTAL PROTEIN: 7 g/dL (ref 6.1–8.1)

## 2015-12-05 LAB — TSH: TSH: 2.68 m[IU]/L (ref 0.40–4.50)

## 2015-12-05 MED ORDER — MECLIZINE HCL 25 MG PO TABS: 25.0000 mg | ORAL_TABLET | Freq: Three times a day (TID) | ORAL | 0 refills | Status: DC | PRN

## 2015-12-05 MED ORDER — FLUTICASONE PROPIONATE 50 MCG/ACT NA SUSP: 2.0000 | Freq: Every day | NASAL | 11 refills | Status: DC

## 2015-12-05 MED ORDER — OXYMETAZOLINE HCL 0.05 % NA SOLN: 1.0000 | Freq: Two times a day (BID) | NASAL | 0 refills | Status: DC

## 2015-12-05 MED ORDER — PSEUDOEPHEDRINE HCL 60 MG PO TABS: 60.0000 mg | ORAL_TABLET | Freq: Three times a day (TID) | ORAL | 0 refills | Status: DC | PRN

## 2015-12-05 NOTE — Patient Instructions (Addendum)

## 2015-12-05 NOTE — Progress Notes (Signed)
Subjective:    Patient ID: Jacob Andrews, male    DOB: [birth year], 30 y.o.   MRN: [id number]  HPI: Jacob Andrews is a 30 y.o. male presenting on 12/05/2015 for Depression (pt run out his meds yuesterday and he is very dizzy today.)   HPI  Pt presents for depression follow-up. He is also c/o of dizziness. Dizziness started yesterday. Was a little sensitive to light yesterday. Dizziness started yesterday morning- dizzy throughout the day. Dizziness is worse when he turns his head. No chest pain. No shortness of breath. No nausea. Worked on Saturday. Felt fine. Pt is reporting "tunnel vision" feels light headed.  Orthostatics 132/68 sitting. 130/72 standing.   Past Medical History:  Diagnosis Date  . Depression   . IBS (irritable bowel syndrome)   . Kidney stone     Current Outpatient Prescriptions on File Prior to Visit  Medication Sig  . Cholecalciferol 2000 UNITS CAPS Take 1 capsule (2,000 Units total) by mouth daily.  Marland Kitchen dicyclomine (BENTYL) 20 MG tablet Take 1 tablet (20 mg total) by mouth 4 (four) times daily -  before meals and at bedtime.  Marland Kitchen venlafaxine XR (EFFEXOR-XR) 37.5 MG 24 hr capsule Take 2 capsules (75 mg total) by mouth daily with breakfast.   No current facility-administered medications on file prior to visit.     Review of Systems  Constitutional: Negative for chills and fever.  HENT: Negative.   Respiratory: Negative for chest tightness, shortness of breath and wheezing.   Cardiovascular: Negative for chest pain, palpitations and leg swelling.  Gastrointestinal: Negative for abdominal pain, nausea and vomiting.  Endocrine: Negative.   Genitourinary: Negative for discharge, dysuria, penile pain, testicular pain and urgency.  Musculoskeletal: Negative for arthralgias, back pain and joint swelling.  Skin: Negative.   Neurological: Positive for dizziness. Negative for weakness, numbness and headaches.  Psychiatric/Behavioral: Positive for dysphoric mood. Negative  for sleep disturbance and suicidal ideas.   Per HPI unless specifically indicated above     Objective:    BP (!) 142/87 (BP Location: Left Arm, Patient Position: Sitting, Cuff Size: Normal)   Pulse 70   Temp 98.5 F (36.9 C) (Oral)   Resp 16   Ht 5\' 10"  (1.778 m)   Wt 234 lb (106.1 kg)   BMI 33.58 kg/m   Wt Readings from Last 3 Encounters:  12/05/15 234 lb (106.1 kg)  05/23/15 248 lb (112.5 kg)  02/07/15 226 lb 9.6 oz (102.8 kg)    Physical Exam  Constitutional: He is oriented to person, place, and time. He appears well-developed and well-nourished. No distress.  HENT:  Head: Normocephalic and atraumatic.  Right Ear: Hearing normal. No middle ear effusion.  Lateralization to L ear. AC >BC bilateral.   Eyes: Conjunctivae and lids are normal. Pupils are equal, round, and reactive to light. Right eye exhibits nystagmus (oscillating when moving eyes to L). Left eye exhibits nystagmus (oscillating when moving eyes to L).  Neck: Neck supple. No thyromegaly present.  Cardiovascular: Normal rate, regular rhythm and normal heart sounds.  Exam reveals no gallop and no friction rub.   No murmur heard. Pulmonary/Chest: Effort normal and breath sounds normal. He has no wheezes.  Abdominal: Soft. Bowel sounds are normal. He exhibits no distension. There is no tenderness. There is no rebound.  Musculoskeletal: Normal range of motion. He exhibits no edema or tenderness.  Neurological: He is alert and oriented to person, place, and time. He has normal strength and normal reflexes. No  cranial nerve deficit or sensory deficit. He displays a negative Romberg sign.  Pt did not lose balance or sway with Romberg but he did report feeling more dizzy when eyes where closed.   Skin: Skin is warm and dry. No rash noted. No erythema.  Psychiatric: He has a normal mood and affect. His behavior is normal. Thought content normal.   Results for orders placed or performed during the hospital encounter of  06/20/07  Hemoglobin-hemacue, POC  Result Value Ref Range   Operator id [id number]    Hemoglobin 15.0       Assessment & Plan:   Problem List Items Addressed This Visit      Other   Depression   Relevant Orders   TSH   VITAMIN D 25 Hydroxy (Vit-D Deficiency, Fractures)    Other Visit Diagnoses    Dizziness    -  Primary   Likely 2/2 BPPV consider L OME. ECG normal. Labwork to r/o anemia and other causes. Trial of meclizine to help with symptoms. Recheck 2 weeks.    Relevant Medications   meclizine (ANTIVERT) 25 MG tablet   Other Relevant Orders   EKG 12-Lead   COMPLETE METABOLIC PANEL WITH GFR   CBC with Differential   OME (otitis media with effusion), left       Flonase, sudafed, and afrin to help with ear congestion. Consider ENT if symptoms don't improve.    Relevant Medications   fluticasone (FLONASE) 50 MCG/ACT nasal spray   oxymetazoline (AFRIN NASAL SPRAY) 0.05 % nasal spray   pseudoephedrine (SUDAFED) 60 MG tablet      Meds ordered this encounter  Medications  . meclizine (ANTIVERT) 25 MG tablet    Sig: Take 1 tablet (25 mg total) by mouth 3 (three) times daily as needed for dizziness.    Dispense:  30 tablet    Refill:  0    Order Specific Question:   Supervising Provider    Answer:   Arlis Porta [phone]  . fluticasone (FLONASE) 50 MCG/ACT nasal spray    Sig: Place 2 sprays into both nostrils daily.    Dispense:  16 g    Refill:  11    Order Specific Question:   Supervising Provider    Answer:   Arlis Porta [phone]  . oxymetazoline (AFRIN NASAL SPRAY) 0.05 % nasal spray    Sig: Place 1 spray into left nostril 2 (two) times daily. For 3 days only.    Dispense:  30 mL    Refill:  0    Order Specific Question:   Supervising Provider    Answer:   Arlis Porta [id number]  . pseudoephedrine (SUDAFED) 60 MG tablet    Sig: Take 1 tablet (60 mg total) by mouth every 8 (eight) hours as needed.    Dispense:  20 tablet    Refill:  0    Order  Specific Question:   Supervising Provider    Answer:   Arlis Porta [id number]      Follow up plan: No Follow-up on file.

## 2015-12-06 ENCOUNTER — Other Ambulatory Visit: Payer: Self-pay | Admitting: Family Medicine

## 2015-12-06 LAB — VITAMIN D 25 HYDROXY (VIT D DEFICIENCY, FRACTURES): Vit D, 25-Hydroxy: 23 ng/mL — ABNORMAL LOW (ref 30–100)

## 2015-12-06 MED ORDER — VITAMIN D (ERGOCALCIFEROL) 1.25 MG (50000 UNIT) PO CAPS: 50000.0000 [IU] | ORAL_CAPSULE | ORAL | 1 refills | Status: DC

## 2015-12-20 ENCOUNTER — Ambulatory Visit (INDEPENDENT_AMBULATORY_CARE_PROVIDER_SITE_OTHER): Payer: 59 | Admitting: Family Medicine

## 2015-12-20 VITALS — BP 134/81 | HR 78 | Temp 98.9°F | Resp 16 | Ht 70.0 in | Wt 239.6 lb

## 2015-12-20 DIAGNOSIS — F32A Depression, unspecified: Secondary | ICD-10-CM

## 2015-12-20 DIAGNOSIS — R42 Dizziness and giddiness: Secondary | ICD-10-CM

## 2015-12-20 DIAGNOSIS — F329 Major depressive disorder, single episode, unspecified: Secondary | ICD-10-CM

## 2015-12-20 MED ORDER — VENLAFAXINE HCL ER 75 MG PO CP24: 150.0000 mg | ORAL_CAPSULE | Freq: Every day | ORAL | 11 refills | Status: DC

## 2015-12-20 NOTE — Assessment & Plan Note (Signed)
Will increase Effexor to 150mg  once daily for better effect. Recheck 4 weeks. Encouraged daily exercise to help with symptoms.

## 2015-12-20 NOTE — Progress Notes (Signed)
Subjective:    Patient ID: Jacob Andrews, male    DOB: 10-18-1985, 30 y.o.   MRN: [id number]  HPI: Jacob Andrews is a 30 y.o. male presenting on 12/20/2015 for Dizziness (improved.)   HPI  Dizziness is resolved. Doing well. Taking flonase has helped with congestion.  Feeling like depression is not being maintained on his current dose of Effexor. Would like to increase. No SI/HI.   Past Medical History:  Diagnosis Date  . Depression   . IBS (irritable bowel syndrome)   . Kidney stone     Current Outpatient Prescriptions on File Prior to Visit  Medication Sig  . Cholecalciferol 2000 UNITS CAPS Take 1 capsule (2,000 Units total) by mouth daily.  Marland Kitchen dicyclomine (BENTYL) 20 MG tablet Take 1 tablet (20 mg total) by mouth 4 (four) times daily -  before meals and at bedtime.  . fluticasone (FLONASE) 50 MCG/ACT nasal spray Place 2 sprays into both nostrils daily.  . meclizine (ANTIVERT) 25 MG tablet Take 1 tablet (25 mg total) by mouth 3 (three) times daily as needed for dizziness.  Marland Kitchen oxymetazoline (AFRIN NASAL SPRAY) 0.05 % nasal spray Place 1 spray into left nostril 2 (two) times daily. For 3 days only.  . pseudoephedrine (SUDAFED) 60 MG tablet Take 1 tablet (60 mg total) by mouth every 8 (eight) hours as needed.  . Vitamin D, Ergocalciferol, (DRISDOL) 50000 units CAPS capsule Take 1 capsule (50,000 Units total) by mouth every 7 (seven) days.   No current facility-administered medications on file prior to visit.     Review of Systems  Constitutional: Negative for chills and fever.  HENT: Negative.   Respiratory: Negative for chest tightness, shortness of breath and wheezing.   Cardiovascular: Negative for chest pain, palpitations and leg swelling.  Gastrointestinal: Negative for abdominal pain, nausea and vomiting.  Endocrine: Negative.   Genitourinary: Negative for discharge, dysuria, penile pain, testicular pain and urgency.  Musculoskeletal: Negative for arthralgias, back pain  and joint swelling.  Skin: Negative.   Neurological: Negative for dizziness, weakness, numbness and headaches.  Psychiatric/Behavioral: Negative for dysphoric mood and sleep disturbance.   Per HPI unless specifically indicated above     Objective:    BP 134/81 (BP Location: Right Arm, Patient Position: Sitting, Cuff Size: Normal)   Pulse 78   Temp 98.9 F (37.2 C) (Oral)   Resp 16   Ht _0  (1.778 m)   Wt 239 lb 9.6 oz (108.7 kg)   BMI 34.38 kg/m   Wt Readings from Last 3 Encounters:  12/20/15 239 lb 9.6 oz (108.7 kg)  12/05/15 234 lb (106.1 kg)  05/23/15 248 lb (112.5 kg)    Physical Exam  Constitutional: He is oriented to person, place, and time. He appears well-developed and well-nourished. No distress.  HENT:  Head: Normocephalic and atraumatic.  Right Ear: Hearing and tympanic membrane normal.  Left Ear: Hearing and tympanic membrane normal.  No lateralization to either ear. AC >BC both ears.   Neck: Neck supple. No thyromegaly present.  Cardiovascular: Normal rate, regular rhythm and normal heart sounds.  Exam reveals no gallop and no friction rub.   No murmur heard. Pulmonary/Chest: Effort normal and breath sounds normal. He has no wheezes.  Abdominal: Soft. Bowel sounds are normal. He exhibits no distension. There is no tenderness. There is no rebound.  Musculoskeletal: Normal range of motion. He exhibits no edema or tenderness.  Neurological: He is alert and oriented to person, place, and time.  He has normal reflexes.  Skin: Skin is warm and dry. No rash noted. No erythema.  Psychiatric: He has a normal mood and affect. His behavior is normal. Thought content normal.   Results for orders placed or performed in visit on 12/05/15  COMPLETE METABOLIC PANEL WITH GFR  Result Value Ref Range   Sodium 140 135 - 146 mmol/L   Potassium 4.2 3.5 - 5.3 mmol/L   Chloride 105 98 - 110 mmol/L   CO2 24 20 - 31 mmol/L   Glucose, Bld 92 65 - 99 mg/dL   BUN 13 7 - 25 mg/dL    Creat 0.86 0.60 - 1.35 mg/dL   Total Bilirubin 0.3 0.2 - 1.2 mg/dL   Alkaline Phosphatase 52 40 - 115 U/L   AST 14 10 - 40 U/L   ALT 17 9 - 46 U/L   Total Protein 7.0 6.1 - 8.1 g/dL   Albumin 4.5 3.6 - 5.1 g/dL   Calcium 9.3 8.6 - 10.3 mg/dL   GFR, Est African American >89 >=60 mL/min   GFR, Est Non African American >89 >=60 mL/min  CBC with Differential  Result Value Ref Range   WBC 6.8 3.8 - 10.8 K/uL   RBC 4.89 4.20 - 5.80 MIL/uL   Hemoglobin 15.2 13.2 - 17.1 g/dL   HCT 44.1 38.5 - 50.0 %   MCV 90.2 80.0 - 100.0 fL   MCH 31.1 27.0 - 33.0 pg   MCHC 34.5 32.0 - 36.0 g/dL   RDW 13.2 11.0 - 15.0 %   Platelets 233 140 - 400 K/uL   MPV 10.2 7.5 - 12.5 fL   Neutro Abs 3,536 1,500 - 7,800 cells/uL   Lymphs Abs 2,584 850 - 3,900 cells/uL   Monocytes Absolute 340 200 - 950 cells/uL   Eosinophils Absolute 340 15 - 500 cells/uL   Basophils Absolute 0 0 - 200 cells/uL   Neutrophils Relative % 52 %   Lymphocytes Relative 38 %   Monocytes Relative 5 %   Eosinophils Relative 5 %   Basophils Relative 0 %   Smear Review Criteria for review not met   TSH  Result Value Ref Range   TSH 2.68 0.40 - 4.50 mIU/L  VITAMIN D 25 Hydroxy (Vit-D Deficiency, Fractures)  Result Value Ref Range   Vit D, 25-Hydroxy 23 (L) 30 - 100 ng/mL      Assessment & Plan:   Problem List Items Addressed This Visit      Other   Depression - Primary    Will increase Effexor to 156m once daily for better effect. Recheck 4 weeks. Encouraged daily exercise to help with symptoms.       Relevant Medications   venlafaxine XR (EFFEXOR-XR) 75 MG 24 hr capsule    Other Visit Diagnoses    Dizziness       Resolved. Likely vertigo. Continue flonase for congestion.       Meds ordered this encounter  Medications  . DISCONTD: WAL-PHED 30 MG tablet    Refill:  0  . venlafaxine XR (EFFEXOR-XR) 75 MG 24 hr capsule    Sig: Take 2 capsules (150 mg total) by mouth daily with breakfast.    Dispense:  60 capsule     Refill:  11    Order Specific Question:   Supervising Provider    Answer:   HArlis Porta[[phone]     Follow up plan: Return in about 4 weeks (around 01/17/2016), or if symptoms worsen  or fail to improve.

## 2015-12-20 NOTE — Patient Instructions (Signed)
We will increase your venlafaxine to 150mg  once daily. Take 2 75mg  pills once daily.

## 2016-07-09 ENCOUNTER — Ambulatory Visit (INDEPENDENT_AMBULATORY_CARE_PROVIDER_SITE_OTHER): Payer: Commercial Managed Care - HMO | Admitting: Family Medicine

## 2016-07-09 ENCOUNTER — Ambulatory Visit (INDEPENDENT_AMBULATORY_CARE_PROVIDER_SITE_OTHER): Payer: Commercial Managed Care - HMO

## 2016-07-09 ENCOUNTER — Encounter: Payer: Self-pay | Admitting: Family Medicine

## 2016-07-09 VITALS — BP 132/88 | HR 82 | Temp 98.3°F | Ht 71.0 in | Wt 234.0 lb

## 2016-07-09 DIAGNOSIS — H6592 Unspecified nonsuppurative otitis media, left ear: Secondary | ICD-10-CM | POA: Diagnosis not present

## 2016-07-09 DIAGNOSIS — K580 Irritable bowel syndrome with diarrhea: Secondary | ICD-10-CM

## 2016-07-09 DIAGNOSIS — M25561 Pain in right knee: Secondary | ICD-10-CM | POA: Insufficient documentation

## 2016-07-09 DIAGNOSIS — G8929 Other chronic pain: Secondary | ICD-10-CM | POA: Diagnosis not present

## 2016-07-09 DIAGNOSIS — N5319 Other ejaculatory dysfunction: Secondary | ICD-10-CM

## 2016-07-09 DIAGNOSIS — F32A Depression, unspecified: Secondary | ICD-10-CM

## 2016-07-09 DIAGNOSIS — F329 Major depressive disorder, single episode, unspecified: Secondary | ICD-10-CM | POA: Diagnosis not present

## 2016-07-09 MED ORDER — VORTIOXETINE HBR 10 MG PO TABS: 10.0000 mg | ORAL_TABLET | Freq: Every day | ORAL | 1 refills | Status: DC

## 2016-07-09 MED ORDER — VENLAFAXINE HCL ER 75 MG PO CP24: ORAL_CAPSULE | ORAL | 0 refills | Status: DC

## 2016-07-09 MED ORDER — FLUTICASONE PROPIONATE 50 MCG/ACT NA SUSP: 2.0000 | Freq: Every day | NASAL | 11 refills | Status: DC

## 2016-07-09 MED ORDER — ELUXADOLINE 100 MG PO TABS: 100.0000 mg | ORAL_TABLET | Freq: Two times a day (BID) | ORAL | 1 refills | Status: DC

## 2016-07-09 NOTE — Assessment & Plan Note (Signed)
New problem. Likely secondary to Effexor. Tapering off and starting Trintellix.

## 2016-07-09 NOTE — Assessment & Plan Note (Signed)
Uncontrolled. Trial of Viberzi.

## 2016-07-09 NOTE — Assessment & Plan Note (Signed)
New problem. Uncertain etiology is exam is unremarkable. X-ray negative. Referring to orthopedics.

## 2016-07-09 NOTE — Progress Notes (Signed)
Subjective:  Patient ID: Jacob Andrews, male    DOB: 1986/03/18  Age: 31 y.o. MRN: [id number]  CC: IBS, R knee pain, Sexual difficulty  HPI Jacob Andrews is a 31 y.o. male presents to the clinic today with the above complaints.  IBS  Patient has long-standing history of IBS.  He states that his IBS is diarrhea predominant.  He reports recent worsening.  He is currently using bentyl with minimal improvement.  He would like to discuss other treatment options/medications today.  R knee pain  Patient reports he's had right knee pain for the past few months. Moderate in severity.  Pain is located diffusely over the knee.  Worse with range of motion. Particularly worse with going up steps.  He's been taking ibuprofen with some improvement.  No recent fall, trauma, injury.  He does note intermittent associated swelling.  No other associated symptoms.  Sexual difficulty  Patient reports a long-standing issue with ejaculation.  He is able to get and maintain an erection without difficulty. He has trouble "finishing".  He states that he doesn't seem to have much difficulty when masturbating.  His primary difficulty is achieving orgasm with his partner.  He has been on Effexor for quite some time.  We'll discuss this today.  PMH, Surgical Hx, Family Hx, Social History reviewed and updated as below.  Past Medical History:  Diagnosis Date  . Depression   . IBS (irritable bowel syndrome)   . Kidney stone   . OSA (obstructive sleep apnea)   . Vertigo    Past Surgical History:  Procedure Laterality Date  . APPENDECTOMY  2013  . KIDNEY SURGERY  2010  . URETERAL STENT PLACEMENT  2012   Family History  Problem Relation Age of Onset  . Diabetes Father   . Hypertension Mother   . Heart disease Maternal Grandmother     heart attack  . Heart disease Paternal Grandfather   . Stroke Paternal Grandfather    Social History  Substance Use Topics  . Smoking status:  Never Smoker  . Smokeless tobacco: Never Used  . Alcohol use No   Review of Systems  Gastrointestinal: Positive for abdominal pain, diarrhea and nausea.  Endocrine:       Excessive thirst.  Genitourinary:       Sexual difficulty.  Neurological: Positive for dizziness.  Psychiatric/Behavioral:       Sadness, anxiety, stress.  All other systems reviewed and are negative.   Objective:   Today's Vitals: BP 132/88   Pulse 82   Temp 98.3 F (36.8 C) (Oral)   Ht 5\' 11"  (1.803 m)   Wt 234 lb (106.1 kg)   SpO2 99%   BMI 32.64 kg/m   Physical Exam  Constitutional: He is oriented to person, place, and time. He appears well-developed and well-nourished. No distress.  HENT:  Head: Normocephalic and atraumatic.  Nose: Nose normal.  Mouth/Throat: Oropharynx is clear and moist. No oropharyngeal exudate.  Normal TM's bilaterally.   Eyes: Conjunctivae are normal. No scleral icterus.  Neck: Neck supple.  Cardiovascular: Normal rate and regular rhythm.   No murmur heard. Pulmonary/Chest: Effort normal and breath sounds normal. He has no wheezes. He has no rales.  Abdominal: Soft. He exhibits no distension. There is no tenderness. There is no rebound and no guarding.  Musculoskeletal:  Knee: Right. Normal to inspection with no erythema or effusion or obvious bony abnormalities.  Palpation normal with no warmth, joint line tenderness, patellar tenderness, or  condyle tenderness. ROM full in flexion and extension and lower leg rotation. Ligaments with solid consistent endpoints including ACL, PCL, LCL, MCL. Patellar and quadriceps tendons unremarkable.  Lymphadenopathy:    He has no cervical adenopathy.  Neurological: He is alert and oriented to person, place, and time.  Skin: Skin is warm and dry. No rash noted.  Psychiatric: He has a normal mood and affect.  Vitals reviewed.  Assessment & Plan:   Problem List Items Addressed This Visit    Irritable bowel syndrome (IBS) - Primary      Uncontrolled. Trial of Viberzi.      Relevant Medications   Eluxadoline (VIBERZI) 100 MG TABS   Depression   Relevant Medications   venlafaxine XR (EFFEXOR-XR) 75 MG 24 hr capsule   vortioxetine HBr (TRINTELLIX) 10 MG TABS   Right knee pain    New problem. Uncertain etiology is exam is unremarkable. X-ray negative. Referring to orthopedics.      Relevant Orders   DG Knee Complete 4 Views Right (Completed)   Ambulatory referral to Orthopedic Surgery   Ejaculatory disorder    New problem. Likely secondary to Effexor. Tapering off and starting Trintellix.       Other Visit Diagnoses    OME (otitis media with effusion), left       Flonase, sudafed, and afrin to help with ear congestion. Consider ENT if symptoms don't improve.    Relevant Medications   fluticasone (FLONASE) 50 MCG/ACT nasal spray     Meds ordered this encounter  Medications  . fluticasone (FLONASE) 50 MCG/ACT nasal spray    Sig: Place 2 sprays into both nostrils daily.    Dispense:  16 g    Refill:  11  . venlafaxine XR (EFFEXOR-XR) 75 MG 24 hr capsule    Sig: 75 mg daily x 1 week, then every other day x 1 week.    Dispense:  11 capsule    Refill:  0  . vortioxetine HBr (TRINTELLIX) 10 MG TABS    Sig: Take 1 tablet (10 mg total) by mouth daily.    Dispense:  90 tablet    Refill:  1  . Eluxadoline (VIBERZI) 100 MG TABS    Sig: Take 100 mg by mouth 2 (two) times daily.    Dispense:  60 tablet    Refill:  1    Follow-up: Return in about 3 months (around 10/09/2016).  [place]

## 2016-07-09 NOTE — Patient Instructions (Addendum)
Taper Effexor as directed.  Start Trintellix.  Xray today.  Follow up in 3 months  Take care  Dr. Lacinda Axon

## 2016-07-09 NOTE — Progress Notes (Signed)
Pre visit review using our clinic review tool, if applicable. No additional management support is needed unless otherwise documented below in the visit note. 

## 2016-07-12 ENCOUNTER — Telehealth: Payer: Self-pay

## 2016-07-12 NOTE — Telephone Encounter (Signed)
PA for Trintellix completed on the phone, approved from today for one year. PA # [id number].   Tried Zoloft, Effexor and Lexapro in past.

## 2016-10-09 ENCOUNTER — Ambulatory Visit (INDEPENDENT_AMBULATORY_CARE_PROVIDER_SITE_OTHER): Payer: 59 | Admitting: Family Medicine

## 2016-10-09 VITALS — BP 110/68 | HR 73 | Temp 98.2°F | Wt 225.0 lb

## 2016-10-09 DIAGNOSIS — R1011 Right upper quadrant pain: Secondary | ICD-10-CM | POA: Diagnosis not present

## 2016-10-09 DIAGNOSIS — K580 Irritable bowel syndrome with diarrhea: Secondary | ICD-10-CM | POA: Diagnosis not present

## 2016-10-09 DIAGNOSIS — Z13220 Encounter for screening for lipoid disorders: Secondary | ICD-10-CM | POA: Diagnosis not present

## 2016-10-09 LAB — CBC
HCT: 44.1 % (ref 39.0–52.0)
HEMOGLOBIN: 15.3 g/dL (ref 13.0–17.0)
MCHC: 34.6 g/dL (ref 30.0–36.0)
MCV: 91.4 fl (ref 78.0–100.0)
PLATELETS: 305 10*3/uL (ref 150.0–400.0)
RBC: 4.83 Mil/uL (ref 4.22–5.81)
RDW: 13.1 % (ref 11.5–15.5)
WBC: 6.6 10*3/uL (ref 4.0–10.5)

## 2016-10-09 LAB — COMPREHENSIVE METABOLIC PANEL
ALT: 17 U/L (ref 0–53)
AST: 12 U/L (ref 0–37)
Albumin: 4.5 g/dL (ref 3.5–5.2)
Alkaline Phosphatase: 57 U/L (ref 39–117)
BUN: 11 mg/dL (ref 6–23)
CALCIUM: 9.6 mg/dL (ref 8.4–10.5)
CHLORIDE: 104 meq/L (ref 96–112)
CO2: 28 meq/L (ref 19–32)
Creatinine, Ser: 0.8 mg/dL (ref 0.40–1.50)
GFR: 120.94 mL/min (ref >60.00)
GLUCOSE: 90 mg/dL (ref 70–99)
Potassium: 4.4 mEq/L (ref 3.5–5.1)
Sodium: 140 mEq/L (ref 135–145)
Total Bilirubin: 0.6 mg/dL (ref 0.2–1.2)
Total Protein: 7.2 g/dL (ref 6.0–8.3)

## 2016-10-09 LAB — LIPID PANEL
CHOL/HDL RATIO: 6
Cholesterol: 178 mg/dL (ref 0–200)
HDL: 31.2 mg/dL — AB (ref >39.00)
LDL CALC: 117 mg/dL — AB (ref 0–99)
NonHDL: 147.29
TRIGLYCERIDES: 150 mg/dL — AB (ref 0.0–149.0)
VLDL: 30 mg/dL (ref 0.0–40.0)

## 2016-10-09 NOTE — Patient Instructions (Signed)
We will call with the results and then follow up will be scheduled.   Take care  Dr. Lacinda Axon

## 2016-10-09 NOTE — Assessment & Plan Note (Signed)
Referring to GI.

## 2016-10-09 NOTE — Progress Notes (Signed)
Subjective:  Patient ID: Jacob Andrews, male    DOB: [birth year]  Age: 32 y.o. MRN: [id number]  CC: RUQ pain  HPI:  31 year old male presents with the above complaint.  RUQ pain  Intermittently 1 month.  Associated nausea and vomiting.  Happens first thing in the morning. No particular relation to meals.  He was recently seen at urgent care for this.  Moderate in severity.  No known relieving factors.  No known exacerbating factors.  No other complaints at this time.  Social Hx   Social History   Social History  . Marital status: Single    Spouse name: N/A  . Number of children: N/A  . Years of education: N/A   Social History Main Topics  . Smoking status: Never Smoker  . Smokeless tobacco: Never Used  . Alcohol use No  . Drug use: No  . Sexual activity: Yes    Partners: Female     Comment: partner is on the pill.    Other Topics Concern  . Not on file   Social History Narrative  . No narrative on file    Review of Systems  Constitutional: Negative for fever.  Gastrointestinal: Positive for abdominal pain, diarrhea, nausea and vomiting.   Objective:  BP 110/68 (BP Location: Right Arm, Patient Position: Sitting, Cuff Size: Large)   Pulse 73   Temp 98.2 F (36.8 C)   Wt 225 lb (102.1 kg)   SpO2 98%   BMI 31.38 kg/m   BP/Weight 10/09/2016 09/15/930 06/19/5730  Systolic BP 202 542 706  Diastolic BP 68 88 81  Wt. (Lbs) 225 234 239.6  BMI 31.38 32.64 34.38    Physical Exam  Constitutional: He is oriented to person, place, and time. He appears well-developed. No distress.  Cardiovascular: Normal rate and regular rhythm.   Pulmonary/Chest: Effort normal and breath sounds normal. He has no wheezes. He has no rales.  Abdominal: Soft. He exhibits no distension.  + RUQ tenderness. + Murphy sign.  Neurological: He is alert and oriented to person, place, and time.  Psychiatric: He has a normal mood and affect.  Vitals reviewed.  Lab Results    Component Value Date   WBC 6.8 12/05/2015   HGB 15.2 12/05/2015   HCT 44.1 12/05/2015   PLT 233 12/05/2015   GLUCOSE 92 12/05/2015   ALT 17 12/05/2015   AST 14 12/05/2015   NA 140 12/05/2015   K 4.2 12/05/2015   CL 105 12/05/2015   CREATININE 0.86 12/05/2015   BUN 13 12/05/2015   CO2 24 12/05/2015   TSH 2.68 12/05/2015   INR 1.0 03/23/2012    Assessment & Plan:   Problem List Items Addressed This Visit      Digestive   Irritable bowel syndrome (IBS)    Referring to GI.      Relevant Orders   Ambulatory referral to Gastroenterology     Other   RUQ pain - Primary    New problem. Concern for biliary colic/gallstones. RUQ ultrasound ordered. Labs today.      Relevant Orders   US Abdomen Limited RUQ   CBC   Comprehensive metabolic panel    Other Visit Diagnoses    Screening, lipid       Relevant Orders   Lipid panel     Meds ordered this encounter  Medications  . fluticasone (FLONASE) 50 MCG/ACT nasal spray    Sig: 2 sprays by Nasal route once daily.  Follow-up: Pending  [place]

## 2016-10-09 NOTE — Assessment & Plan Note (Signed)
New problem. Concern for biliary colic/gallstones. RUQ ultrasound ordered. Labs today.

## 2016-10-15 ENCOUNTER — Ambulatory Visit
Admission: RE | Admit: 2016-10-15 | Discharge: 2016-10-15 | Disposition: A | Discharge status: Home / Self Care | Payer: 59 | Source: Ambulatory Visit | Attending: Family Medicine | Admitting: Family Medicine

## 2016-10-15 DIAGNOSIS — R1011 Right upper quadrant pain: Secondary | ICD-10-CM | POA: Diagnosis present

## 2016-11-12 ENCOUNTER — Encounter: Payer: Self-pay | Admitting: Nurse Practitioner

## 2016-11-12 ENCOUNTER — Ambulatory Visit (INDEPENDENT_AMBULATORY_CARE_PROVIDER_SITE_OTHER): Payer: 59 | Admitting: Nurse Practitioner

## 2016-11-12 VITALS — BP 114/59 | HR 66 | Temp 98.2°F | Ht 71.0 in | Wt 224.2 lb

## 2016-11-12 DIAGNOSIS — F321 Major depressive disorder, single episode, moderate: Secondary | ICD-10-CM

## 2016-11-12 DIAGNOSIS — F419 Anxiety disorder, unspecified: Secondary | ICD-10-CM | POA: Insufficient documentation

## 2016-11-12 DIAGNOSIS — K580 Irritable bowel syndrome with diarrhea: Secondary | ICD-10-CM | POA: Diagnosis not present

## 2016-11-12 MED ORDER — BUSPIRONE HCL 5 MG PO TABS: 5.0000 mg | ORAL_TABLET | Freq: Three times a day (TID) | ORAL | 1 refills | Status: DC

## 2016-11-12 MED ORDER — TRAZODONE HCL 50 MG PO TABS: 50.0000 mg | ORAL_TABLET | Freq: Every day | ORAL | 1 refills | Status: DC

## 2016-11-12 NOTE — Progress Notes (Signed)
Subjective:    Patient ID: Jacob Andrews, male    DOB: 09-Mar-1986, 31 y.o.   MRN: [id number]  Jacob Andrews is a 31 y.o. male presenting on 11/12/2016 for Depression   HPI   Depression and Anxiety Not on medications at this time. Effexor was making depression worse, so he stopped taking it. Work is significant stressor and is taking work stress home with him.   Severe anxiety internally and at home [place] concerned for him.  Never sleeps 11pm-5am interrupted.  No full night's sleep in 3-4 years. Tearful w/ boss on phone. Pt notes tremor at times during day when especially anxious, nervous, and on edge. Pt notes SI/HI 2 times over last 1 week.  Denies plan and states he has no desire to actually carry it out.  He doesn't believe that he could ever carry it out.   IBS Fiber supplements and probiotics.  Avoids dairy r/t lactose intolerance. Has appointment w/ GI in about 3 weeks.  Symptoms currently manageable.  GAD 7 : Generalized Anxiety Score 11/12/2016  Nervous, Anxious, on Edge 2  Control/stop worrying 3  Worry too much - different things 3  Trouble relaxing 2  Restless 1  Easily annoyed or irritable 2  Afraid - awful might happen 3  Total GAD 7 Score 16  Anxiety Difficulty Very difficult    Depression screen [facility] 2/9 11/12/2016 12/20/2015 12/05/2015 05/23/2015 02/07/2015  Decreased Interest 2 1 2 1  0  Down, Depressed, Hopeless 3 1 1 1  0  PHQ - 2 Score 5 2 3 2  0  Altered sleeping 2 2 2 3  -  Tired, decreased energy 2 1 2 3  -  Change in appetite 2 2 2 1  -  Feeling bad or failure about yourself  2 1 2 1  -  Trouble concentrating 1 1 1  0 -  Moving slowly or fidgety/restless 1 0 0 0 -  Suicidal thoughts 1 0 0 0 -  PHQ-9 Score 16 9 12 10  -  Difficult doing work/chores - - Somewhat difficult Somewhat difficult -     Social History  Substance Use Topics  . Smoking status: Never Smoker  . Smokeless tobacco: Never Used  . Alcohol use No    Review of Systems Per HPI unless  specifically indicated above     Objective:    BP (!) 114/59 (BP Location: Right Arm, Patient Position: Sitting, Cuff Size: Large)   Pulse 66   Temp 98.2 F (36.8 C) (Oral)   Ht 5\' 11"  (1.803 m)   Wt 224 lb 3.2 oz (101.7 kg)   BMI 31.27 kg/m   Wt Readings from Last 3 Encounters:  11/12/16 224 lb 3.2 oz (101.7 kg)  10/09/16 225 lb (102.1 kg)  07/09/16 234 lb (106.1 kg)    Physical Exam  General - obese, well-appearing, NAD HEENT - Normocephalic, atraumatic Neck - supple, non-tender, no LAD Heart - RRR, no murmurs heard Lungs - Clear throughout all lobes, no wheezing, crackles, or rhonchi. Normal work of breathing. Extremeties - non-tender, no edema, cap refill < 2 seconds, peripheral pulses intact +2 bilaterally Skin - warm, dry Neuro - awake, alert, oriented x3, normal gait, no tremor Psych - Normal mood and affect, normal behavior      Results for orders placed or performed in visit on 10/09/16  CBC  Result Value Ref Range   WBC 6.6 4.0 - 10.5 K/uL   RBC 4.83 4.22 - 5.81 Mil/uL   Platelets 305.0 150.0 -  400.0 K/uL   Hemoglobin 15.3 13.0 - 17.0 g/dL   HCT 44.1 39.0 - 52.0 %   MCV 91.4 78.0 - 100.0 fl   MCHC 34.6 30.0 - 36.0 g/dL   RDW 13.1 11.5 - 15.5 %  Comprehensive metabolic panel  Result Value Ref Range   Sodium 140 135 - 145 mEq/L   Potassium 4.4 3.5 - 5.1 mEq/L   Chloride 104 96 - 112 mEq/L   CO2 28 19 - 32 mEq/L   Glucose, Bld 90 70 - 99 mg/dL   BUN 11 6 - 23 mg/dL   Creatinine, Ser 0.80 0.40 - 1.50 mg/dL   Total Bilirubin 0.6 0.2 - 1.2 mg/dL   Alkaline Phosphatase 57 39 - 117 U/L   AST 12 0 - 37 U/L   ALT 17 0 - 53 U/L   Total Protein 7.2 6.0 - 8.3 g/dL   Albumin 4.5 3.5 - 5.2 g/dL   Calcium 9.6 8.4 - 10.5 mg/dL   GFR 120.94 >60.00 mL/min  Lipid panel  Result Value Ref Range   Cholesterol 178 0 - 200 mg/dL   Triglycerides 150.0 (H) 0.0 - 149.0 mg/dL   HDL 31.20 (L) >39.00 mg/dL   VLDL 30.0 0.0 - 40.0 mg/dL   LDL Cholesterol 117 (H) 0 - 99  mg/dL   Total CHOL/HDL Ratio 6    NonHDL 147.29       Assessment & Plan:   Problem List Items Addressed This Visit      Digestive   Irritable bowel syndrome (IBS)    No significant improvement.  Pt has GI referral processing.  Plan: 1. Proceed w/ GI referral. 2. Encouraged resuming metamucil and probiotic prior to appointment. 3. Follow up as needed.        Other   Depression - Primary    Uncontrolled.  Pt not currently on medications.  Has had difficulty w/ finding medication that will help without making his depression worse.  Pt notes SI but no plan.  Plan: 1. Encouraged counseling. 2. Start buspirone 5 mg up to tid.  Start w/ 1 tablet daily x 1 week increasing to 1 tablet bid for 1 week, then 1 tablet tid if needed. 3. Start trazodone 50 mg at bedtime.  Use for assistance w/ sleep and mood. 4. Reviewed SI and if develops plan, seek care at ED. 5. Followup 4 weeks.      Relevant Medications   busPIRone (BUSPAR) 5 MG tablet   traZODone (DESYREL) 50 MG tablet   Anxiety    See A/P for depression.      Relevant Medications   busPIRone (BUSPAR) 5 MG tablet   traZODone (DESYREL) 50 MG tablet      Meds ordered this encounter  Medications  . promethazine (PHENERGAN) 25 MG tablet    Refill:  0  . DISCONTD: vortioxetine HBr (TRINTELLIX) 10 MG TABS    Sig: Take by mouth.  . busPIRone (BUSPAR) 5 MG tablet    Sig: Take 1 tablet (5 mg total) by mouth 3 (three) times daily.    Dispense:  90 tablet    Refill:  1    Order Specific Question:   Supervising Provider    Answer:   Olin Hauser [[id number]]  . traZODone (DESYREL) 50 MG tablet    Sig: Take 1 tablet (50 mg total) by mouth at bedtime.    Dispense:  30 tablet    Refill:  1    Order Specific Question:   Supervising  Provider    Answer:   Olin Hauser [[id number]]      Follow up plan: Return in about 4 weeks (around 12/10/2016) for anxiety and depression.  A total of 40 minutes was spent  face-to-face with this patient. Greater than 50% of this time was spent in counseling and coordination of care with the patient regarding anxiety, depression, and stress management.   [name], DNP, AGPCNP-BC Adult Gerontology Primary Care Nurse Practitioner [facility] 11/13/2016, 10:41 PM

## 2016-11-12 NOTE — Patient Instructions (Addendum)
Keelyn, Thank you for coming in to clinic today.  1. Depression: - START buspirone 5 mg twice daily.  Take once daily 3-7 days, then increase by one dose daily until at max of 3 doses each day. - START trazodone 50 mg tablet Take 1/2 tablet 1 week, then increase to full tablet and continue.  Psych Counseling ONLY Self Referral: 1. [facility] [place]   Address: [place], [place], [place] [address] Hours: Open today  9AM-7PM Phone: [phone]  2. [place] [place] Address: [address] [place], [place], [place] [address] Phone: [phone]   [place] Self Referral [facility]) [place] [address] [place], [place] [address] Phone: [phone]  [employer], available walk-in 9am-4pm M-F [address], [place] [address] Hours: [place] (M-F, walk in available) [phone]   Please schedule a follow-up appointment with [name], AGNP. Return in about 4 weeks (around 12/10/2016) for anxiety and depression.  If you have any other questions or concerns, please feel free to call the clinic or send a message through [place]. You may also schedule an earlier appointment if necessary.  You will receive a survey after today's visit either digitally by e-mail or paper by [employer]. Your experiences and feedback matter to [place].  Please respond so we know how we are doing as we provide care for you.   [name], DNP, AGNP-BC Adult Gerontology Nurse Practitioner [place]

## 2016-11-13 NOTE — Assessment & Plan Note (Signed)
Uncontrolled.  Pt not currently on medications.  Has had difficulty w/ finding medication that will help without making his depression worse.  Pt notes SI but no plan.  Plan: 1. Encouraged counseling. 2. Start buspirone 5 mg up to tid.  Start w/ 1 tablet daily x 1 week increasing to 1 tablet bid for 1 week, then 1 tablet tid if needed. 3. Start trazodone 50 mg at bedtime.  Use for assistance w/ sleep and mood. 4. Reviewed SI and if develops plan, seek care at ED. 5. Followup 4 weeks.

## 2016-11-13 NOTE — Progress Notes (Signed)
I have reviewed this encounter including the documentation in this note and/or discussed this patient with the provider, [name], AGPCNP-BC. I am certifying that I agree with the content of this note as supervising physician.  Nobie Putnam, [facility] 11/13/2016, 10:44 PM

## 2016-11-13 NOTE — Assessment & Plan Note (Signed)
See A&P for depression

## 2016-11-13 NOTE — Assessment & Plan Note (Signed)
No significant improvement.  Pt has GI referral processing.  Plan: 1. Proceed w/ GI referral. 2. Encouraged resuming metamucil and probiotic prior to appointment. 3. Follow up as needed.

## 2016-11-27 ENCOUNTER — Ambulatory Visit (INDEPENDENT_AMBULATORY_CARE_PROVIDER_SITE_OTHER): Payer: 59 | Admitting: Nurse Practitioner

## 2016-11-27 ENCOUNTER — Encounter: Payer: Self-pay | Admitting: Nurse Practitioner

## 2016-11-27 DIAGNOSIS — F321 Major depressive disorder, single episode, moderate: Secondary | ICD-10-CM

## 2016-11-27 DIAGNOSIS — R1114 Bilious vomiting: Secondary | ICD-10-CM

## 2016-11-27 DIAGNOSIS — F419 Anxiety disorder, unspecified: Secondary | ICD-10-CM

## 2016-11-27 DIAGNOSIS — R1084 Generalized abdominal pain: Secondary | ICD-10-CM

## 2016-11-27 MED ORDER — POLYETHYLENE GLYCOL 3350 17 G PO PACK: 17.0000 g | PACK | Freq: Every day | ORAL | 0 refills | Status: DC

## 2016-11-27 MED ORDER — ONDANSETRON HCL 4 MG PO TABS: 4.0000 mg | ORAL_TABLET | Freq: Three times a day (TID) | ORAL | 1 refills | Status: DC | PRN

## 2016-11-27 MED ORDER — BUSPIRONE HCL 10 MG PO TABS: 10.0000 mg | ORAL_TABLET | Freq: Two times a day (BID) | ORAL | 5 refills | Status: DC

## 2016-11-27 NOTE — Progress Notes (Deleted)
Subjective:    Patient ID: Jacob Andrews, male    DOB: 03/29/1986, 31 y.o.   MRN: [id number]  Jacob Andrews is a 31 y.o. male presenting on 11/27/2016 for Irritable Bowel Syndrome (vomiting & diarrhea at the same time )   HPI IBS NO improvement of symptoms w/ management of anxiety and depression. Bentyl only helps 2030 minutes at a time.  Phenergan helps nausea for about 2-3 hours w/ drowsiness.  Appendectomy about 5 years ago and hasn't had regular BM since this surgery.  Has had regular BM prior to appendectomy.  Trans abdominal kidney surgery  Calcium stones:   Wakes 5am, "bubbling" liquid stool w/ vomiting.  THroughout day feels constipated (hurting stomach), then sudden urge to defecate w/ liquid stool.  Has transition from hard pellet stool once per week to mostly liquid stool.    - Metamucil in past This May - Took for a couple of months and had stopped before being seen by Dr. Lacinda Axon. - Miralax - never taken.   Nausea in home driveway.   Can't eat lunch when working r/t nausea Brown vomit.     Social History  Substance Use Topics  . Smoking status: Never Smoker  . Smokeless tobacco: Never Used  . Alcohol use No    Review of Systems Per HPI unless specifically indicated above     Objective:    BP 126/71 (BP Location: Right Arm, Patient Position: Sitting, Cuff Size: Large)   Pulse 82   Ht 5\' 11"  (1.803 m)   Wt 217 lb 6.4 oz (98.6 kg)   BMI 30.32 kg/m   Wt Readings from Last 3 Encounters:  11/27/16 217 lb 6.4 oz (98.6 kg)  11/12/16 224 lb 3.2 oz (101.7 kg)  10/09/16 225 lb (102.1 kg)    Physical Exam*** Results for orders placed or performed in visit on 10/09/16  CBC  Result Value Ref Range   WBC 6.6 4.0 - 10.5 K/uL   RBC 4.83 4.22 - 5.81 Mil/uL   Platelets 305.0 150.0 - 400.0 K/uL   Hemoglobin 15.3 13.0 - 17.0 g/dL   HCT 44.1 39.0 - 52.0 %   MCV 91.4 78.0 - 100.0 fl   MCHC 34.6 30.0 - 36.0 g/dL   RDW 13.1 11.5 - 15.5 %  Comprehensive metabolic  panel  Result Value Ref Range   Sodium 140 135 - 145 mEq/L   Potassium 4.4 3.5 - 5.1 mEq/L   Chloride 104 96 - 112 mEq/L   CO2 28 19 - 32 mEq/L   Glucose, Bld 90 70 - 99 mg/dL   BUN 11 6 - 23 mg/dL   Creatinine, Ser 0.80 0.40 - 1.50 mg/dL   Total Bilirubin 0.6 0.2 - 1.2 mg/dL   Alkaline Phosphatase 57 39 - 117 U/L   AST 12 0 - 37 U/L   ALT 17 0 - 53 U/L   Total Protein 7.2 6.0 - 8.3 g/dL   Albumin 4.5 3.5 - 5.2 g/dL   Calcium 9.6 8.4 - 10.5 mg/dL   GFR 120.94 >60.00 mL/min  Lipid panel  Result Value Ref Range   Cholesterol 178 0 - 200 mg/dL   Triglycerides 150.0 (H) 0.0 - 149.0 mg/dL   HDL 31.20 (L) >39.00 mg/dL   VLDL 30.0 0.0 - 40.0 mg/dL   LDL Cholesterol 117 (H) 0 - 99 mg/dL   Total CHOL/HDL Ratio 6    NonHDL 147.29       Assessment & Plan:   Problem List  Items Addressed This Visit    None      No orders of the defined types were placed in this encounter.     Follow up plan: No Follow-up on file.  A total of *** (lvl 3, 4, 5) 15, 25, 40 minutes was spent face-to-face with this patient. Greater than 50% of this time was spent in counseling and coordination of care with the patient. ***  [name], DNP, AGPCNP-BC Adult Gerontology Primary Care Nurse Practitioner [facility] 11/27/2016, 8:50 AM

## 2016-11-27 NOTE — Patient Instructions (Addendum)
Jacob Andrews, Thank you for coming in to clinic today.  1. For your abdominal pain, vomiting, diarrhea: - CT abdomen tomorrow or another day this week.  They will call for this appointment - START miralax one dose once daily.  (17 g)  IF helps, continue.  If worsens, stop.  Try for at least 7 days. - For your nausea, take ondansetron 4 mg tablet dissolves under your tongue.  Take up to 3 times daily as needed for nausea. - Continue bentyl as needed.  2. FOr your anxiety: - CHange buspirone to 10 mg twice daily.   Please schedule a follow-up appointment with [name], AGNP. Return in about 3 months (around 02/27/2017) for anxiety and depression AND abdominal pain sooner if needed.  If you have any other questions or concerns, please feel free to call the clinic or send a message through [facility]. You may also schedule an earlier appointment if necessary.  You will receive a survey after today's visit either digitally by e-mail or paper by [employer]. Your experiences and feedback matter to [place].  Please respond so we know how we are doing as we provide care for you.  [name], DNP, AGNP-BC Adult Gerontology Nurse Practitioner [facility], [facility]   Irritable Bowel Syndrome, Adult Irritable bowel syndrome (IBS) is not one specific disease. It is a group of symptoms that affects the organs responsible for digestion (gastrointestinal or GI tract). To regulate how your GI tract works, your body sends signals back and forth between your intestines and your brain. If you have IBS, there may be a problem with these signals. As a result, your GI tract does not function normally. Your intestines may become more sensitive and overreact to certain things. This is especially true when you eat certain foods or when you are under stress. There are four types of IBS. These may be determined based on the consistency of your stool:  IBS with diarrhea.  IBS with constipation.  Mixed  IBS.  Unsubtyped IBS.  It is important to know which type of IBS you have. Some treatments are more likely to be helpful for certain types of IBS. What are the causes? The exact cause of IBS is not known. What increases the risk? You may have a higher risk of IBS if:  You are a woman.  You are younger than 30 years old.  You have a family history of IBS.  You have mental health problems.  You have had bacterial infection of your GI tract.  What are the signs or symptoms? Symptoms of IBS vary from person to person. The main symptom is abdominal pain or discomfort. Additional symptoms usually include one or more of the following:  Diarrhea, constipation, or both.  Abdominal swelling or bloating.  Feeling full or sick after eating a small or regular-size meal.  Frequent gas.  Mucus in the stool.  A feeling of having more stool left after a bowel movement.  Symptoms tend to come and go. They may be associated with stress, psychiatric conditions, or nothing at all. How is this diagnosed? There is no specific test to diagnose IBS. Your health care provider will make a diagnosis based on a physical exam, medical history, and your symptoms. You may have other tests to rule out other conditions that may be causing your symptoms. These may include:  Blood tests.  X-rays.  CT scan.  Endoscopy and colonoscopy. This is a test in which your GI tract is viewed with  a long, thin, flexible tube.  How is this treated? There is no cure for IBS, but treatment can help relieve symptoms. IBS treatment often includes:  Changes to your diet, such as: ? Eating more fiber. ? Avoiding foods that cause symptoms. ? Drinking more water. ? Eating regular, medium-sized portioned meals.  Medicines. These may include: ? Fiber supplements if you have constipation. ? Medicine to control diarrhea (antidiarrheal medicines). ? Medicine to help control muscle spasms in your GI tract (antispasmodic  medicines). ? Medicines to help with any mental health issues, such as antidepressants or tranquilizers.  Therapy. ? Talk therapy may help with anxiety, depression, or other mental health issues that can make IBS symptoms worse.  Stress reduction. ? Managing your stress can help keep symptoms under control.  Follow these instructions at home:  Take medicines only as directed by your health care provider.  Eat a healthy diet. ? Avoid foods and drinks with added sugar. ? Include more whole grains, fruits, and vegetables gradually into your diet. This may be especially helpful if you have IBS with constipation. ? Avoid any foods and drinks that make your symptoms worse. These may include dairy products and caffeinated or carbonated drinks. ? Do not eat large meals. ? Drink enough fluid to keep your urine clear or pale yellow.  Exercise regularly. Ask your health care provider for recommendations of good activities for you.  Keep all follow-up visits as directed by your health care provider. This is important. Contact a health care provider if:  You have constant pain.  You have trouble or pain with swallowing.  You have worsening diarrhea. Get help right away if:  You have severe and worsening abdominal pain.  You have diarrhea and: ? You have a rash, stiff neck, or severe headache. ? You are irritable, sleepy, or difficult to awaken. ? You are weak, dizzy, or extremely thirsty.  You have bright red blood in your stool or you have black tarry stools.  You have unusual abdominal swelling that is painful.  You vomit continuously.  You vomit blood (hematemesis).  You have both abdominal pain and a fever. This information is not intended to replace advice given to you by your health care provider. Make sure you discuss any questions you have with your health care provider. Document Released: 04/02/2005 Document Revised: 09/02/2015 Document Reviewed: 12/18/2013 Elsevier  Interactive Patient Education  2018 [employer].

## 2016-11-27 NOTE — Progress Notes (Signed)
Subjective:    Patient ID: Jacob Andrews, male    DOB: 06-11-1985, 31 y.o.   MRN: [id number]  Jacob Andrews is a 31 y.o. male presenting on 11/27/2016 for Irritable Bowel Syndrome (vomiting & diarrhea at the same time )   HPI IBS No improvement of symptoms w/ management of anxiety and depression. Bentyl only helps 20-30 minutes at a time.  Phenergan helps nausea for about 2-3 hours w/ drowsiness. Has run out. Primary symptoms: wakes at 5am has "bubbling" in abdomen followed by liquid stool w/ vomiting.  When vomits, is brown and bilious - no smell of poop.  Throughout the day feels constipated/hurting stomach, then sudden urge to defecate w/ liquid stool.  Has transition from hard pellet stool once per week to mostly liquid stool. - Metamucil in past - Last took in May 2018 - took for couple of months and stopped before being seen by Dr. Lacinda Axon in July and didn't notice that it helped. - Miralax - never taken.   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  He had been tolerating symptoms fairly well until he experienced nausea and vomiting in a home driveway.  He then needed to stop working. Can't eat lunch when working r/t nausea.    Prior abdominal surgery: appendectomy 2013, trans abdominal kidney surgery w/ drain.  Anxiety and Depression Improved symptoms on medication.  Pt taking buspirone 5 mg tid and occasionally misses daytime dose.  Pt also taking trazodone 50 mg once tablet at bedtime w/ improved sleep.  Pt pleased w/ response so far noting no recent SI.  Significantly improved anhedonia.  Now feels like socializing w/ friends.  Depression screen [facility] 2/9 11/27/2016 11/12/2016 12/20/2015 12/05/2015 05/23/2015  Decreased Interest 1 2 1 2 1   Down, Depressed, Hopeless 1 3 1 1 1   PHQ - 2 Score 2 5 2 3 2   Altered sleeping 2 2 2 2 3   Tired, decreased energy 2 2 1 2 3   Change in appetite 2 2 2 2 1   Feeling bad or failure about yourself  1 2 1 2 1   Trouble concentrating 1 1 1 1  0  Moving  slowly or fidgety/restless 0 1 0 0 0  Suicidal thoughts 0 1 0 0 0  PHQ-9 Score 10 16 9 12 10   Difficult doing work/chores - - - Somewhat difficult Somewhat difficult   GAD 7 : Generalized Anxiety Score 11/27/2016 11/12/2016  Nervous, Anxious, on Edge 1 2  Control/stop worrying 2 3  Worry too much - different things 1 3  Trouble relaxing 1 2  Restless 1 1  Easily annoyed or irritable 1 2  Afraid - awful might happen 1 3  Total GAD 7 Score 8 16  Anxiety Difficulty Somewhat difficult Very difficult    Social History  Substance Use Topics  . Smoking status: Never Smoker  . Smokeless tobacco: Never Used  . Alcohol use No    Review of Systems Per HPI unless specifically indicated above     Objective:    BP 126/71 (BP Location: Right Arm, Patient Position: Sitting, Cuff Size: Large)   Pulse 82   Ht 5\' 11"  (1.803 m)   Wt 217 lb 6.4 oz (98.6 kg)   BMI 30.32 kg/m   Wt Readings from Last 3 Encounters:  11/27/16 217 lb 6.4 oz (98.6 kg)  11/12/16 224 lb 3.2 oz (101.7 kg)  10/09/16 225 lb (102.1 kg)    Physical Exam  Constitutional: He appears well-developed  and well-nourished. No distress.  HENT:  Head: Normocephalic and atraumatic.  Nose: Nose normal.  Mouth/Throat: Oropharynx is clear and moist.  Neck: Normal range of motion. Neck supple.  Cardiovascular: Normal rate, regular rhythm, normal heart sounds and intact distal pulses.   Pulmonary/Chest: Effort normal and breath sounds normal. No respiratory distress.  Abdominal: Soft. Bowel sounds are normal. He exhibits no distension. There is no hepatosplenomegaly. There is tenderness in the right upper quadrant, epigastric area, left upper quadrant and left lower quadrant. There is no rigidity, no rebound, no guarding, no CVA tenderness, no tenderness at McBurney's point and negative Murphy's sign. No hernia.    Results for orders placed or performed in visit on 10/09/16  CBC  Result Value Ref Range   WBC 6.6 4.0 - 10.5 K/uL     RBC 4.83 4.22 - 5.81 Mil/uL   Platelets 305.0 150.0 - 400.0 K/uL   Hemoglobin 15.3 13.0 - 17.0 g/dL   HCT 44.1 39.0 - 52.0 %   MCV 91.4 78.0 - 100.0 fl   MCHC 34.6 30.0 - 36.0 g/dL   RDW 13.1 11.5 - 15.5 %  Comprehensive metabolic panel  Result Value Ref Range   Sodium 140 135 - 145 mEq/L   Potassium 4.4 3.5 - 5.1 mEq/L   Chloride 104 96 - 112 mEq/L   CO2 28 19 - 32 mEq/L   Glucose, Bld 90 70 - 99 mg/dL   BUN 11 6 - 23 mg/dL   Creatinine, Ser 0.80 0.40 - 1.50 mg/dL   Total Bilirubin 0.6 0.2 - 1.2 mg/dL   Alkaline Phosphatase 57 39 - 117 U/L   AST 12 0 - 37 U/L   ALT 17 0 - 53 U/L   Total Protein 7.2 6.0 - 8.3 g/dL   Albumin 4.5 3.5 - 5.2 g/dL   Calcium 9.6 8.4 - 10.5 mg/dL   GFR 120.94 >60.00 mL/min  Lipid panel  Result Value Ref Range   Cholesterol 178 0 - 200 mg/dL   Triglycerides 150.0 (H) 0.0 - 149.0 mg/dL   HDL 31.20 (L) >39.00 mg/dL   VLDL 30.0 0.0 - 40.0 mg/dL   LDL Cholesterol 117 (H) 0 - 99 mg/dL   Total CHOL/HDL Ratio 6    NonHDL 147.29       Assessment & Plan:   Problem List Items Addressed This Visit      Other   Depression    Improved on medications.  Currently tolerating well.  No SI.  Plan: 1. Encouraged counseling. 2. INCREASE buspirone 10 mg bid (total increase 5 mg per day). 3. CONTINUE trazodone 50 mg at bedtime. 4. Encouraged regular exercise. 5. Followup 3 months.       Relevant Medications   busPIRone (BUSPAR) 10 MG tablet   Anxiety    See A/P for depression.      Relevant Medications   busPIRone (BUSPAR) 10 MG tablet    Other Visit Diagnoses    Generalized abdominal pain       Bilious vomiting with nausea     Pt w/ presumed IBS-D at prior diagnosis.  Bentyl does not provide relief.  Symptoms severe w/ nausea and vomiting in am.  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  Pt notes watery diarrhea frequently throughout day.  Plan: 1. Intermittent FMLA form completed.   Request for on-site work or work from home for accommodations. 2. Take miralax for effort to provide bulk to stool.  Metamucil previously not effective. 3. START ondansetron 4 mg up to 3 times daily as needed for nausea and vomiting.  STOP phenergan. 4. Continue bentyl as needed for diarrhea. 5. CT scan abdomen and pelvis.  Follow up on abdominal US. 6. Referral to GI placed - pt awaiting appointment on 8/27. 7. Follow up as needed after GI consult.   Relevant Medications   polyethylene glycol (MIRALAX) packet   Other Relevant Orders   CT Abdomen Pelvis W Contrast      Meds ordered this encounter  Medications  . busPIRone (BUSPAR) 10 MG tablet    Sig: Take 1 tablet (10 mg total) by mouth 2 (two) times daily.    Dispense:  60 tablet    Refill:  5    Order Specific Question:   Supervising Provider    Answer:   Olin Hauser [[id number]]  . ondansetron (ZOFRAN) 4 MG tablet    Sig: Take 1 tablet (4 mg total) by mouth every 8 (eight) hours as needed for nausea or vomiting.    Dispense:  90 tablet    Refill:  1    Nausea    Order Specific Question:   Supervising Provider    Answer:   Olin Hauser [[id number]]  . polyethylene glycol (MIRALAX) packet    Sig: Take 17 g by mouth daily.    Dispense:  14 each    Refill:  0    Order Specific Question:   Supervising Provider    Answer:   Olin Hauser [[id number]]      Follow up plan: Return in about 3 months (around 02/27/2017) for anxiety and depression AND abdominal pain sooner if needed.   [name], DNP, AGPCNP-BC Adult Gerontology Primary Care Nurse Practitioner [facility] 11/27/2016, 2:49 PM

## 2016-11-30 NOTE — Assessment & Plan Note (Signed)
See A&P for depression

## 2016-11-30 NOTE — Assessment & Plan Note (Addendum)
Improved on medications.  Currently tolerating well.  No SI.  Plan: 1. Encouraged counseling. 2. INCREASE buspirone 10 mg bid (total increase 5 mg per day). 3. CONTINUE trazodone 50 mg at bedtime. 4. Encouraged regular exercise. 5. Followup 3 months.

## 2016-11-30 NOTE — Progress Notes (Signed)
I have reviewed this encounter including the documentation in this note and/or discussed this patient with the provider, [name], AGPCNP-BC. I am certifying that I agree with the content of this note as supervising physician.  Nobie Putnam, [facility] 11/30/2016, 5:26 PM

## 2016-12-01 ENCOUNTER — Encounter: Payer: Self-pay | Admitting: Nurse Practitioner

## 2016-12-03 ENCOUNTER — Telehealth: Payer: Self-pay | Admitting: Family Medicine

## 2016-12-03 NOTE — Telephone Encounter (Signed)
Pt had forms filled out for LOA and the the return date has changed and he needs the paper work to have Sept 6th.  His work needs and updated copy.  His call back number is [phone]

## 2016-12-03 NOTE — Telephone Encounter (Signed)
Pt called. New form emailed to provider.  Completed and will be faxed.

## 2016-12-04 ENCOUNTER — Ambulatory Visit
Admission: RE | Admit: 2016-12-04 | Discharge: 2016-12-04 | Disposition: A | Discharge status: Home / Self Care | Payer: 59 | Source: Ambulatory Visit | Attending: Nurse Practitioner | Admitting: Nurse Practitioner

## 2016-12-04 ENCOUNTER — Other Ambulatory Visit: Payer: Self-pay | Admitting: Nurse Practitioner

## 2016-12-04 ENCOUNTER — Telehealth: Payer: Self-pay | Admitting: Nurse Practitioner

## 2016-12-04 DIAGNOSIS — N2889 Other specified disorders of kidney and ureter: Secondary | ICD-10-CM | POA: Insufficient documentation

## 2016-12-04 DIAGNOSIS — R1114 Bilious vomiting: Secondary | ICD-10-CM

## 2016-12-04 DIAGNOSIS — R1084 Generalized abdominal pain: Secondary | ICD-10-CM | POA: Diagnosis present

## 2016-12-04 DIAGNOSIS — N201 Calculus of ureter: Secondary | ICD-10-CM | POA: Diagnosis not present

## 2016-12-04 DIAGNOSIS — N200 Calculus of kidney: Secondary | ICD-10-CM

## 2016-12-04 MED ORDER — IOPAMIDOL (ISOVUE-300) INJECTION 61%
100.0000 mL | Freq: Once | INTRAVENOUS | Status: AC | PRN
  Administered 2016-12-04: 100 mL via INTRAVENOUS

## 2016-12-04 NOTE — Telephone Encounter (Signed)
Brant with [employer] needs clarification on pt's short term disability paper work.  He said the dates didn't match.  His call back number is [phone] ext [phone]

## 2016-12-04 NOTE — Telephone Encounter (Signed)
Called Brent/Brant and left message.  Return to work date will be December 20, 2016

## 2016-12-10 ENCOUNTER — Ambulatory Visit: Payer: 59 | Admitting: Nurse Practitioner

## 2016-12-10 ENCOUNTER — Encounter: Payer: Self-pay | Admitting: Gastroenterology

## 2016-12-10 ENCOUNTER — Other Ambulatory Visit: Payer: Self-pay

## 2016-12-10 ENCOUNTER — Ambulatory Visit (INDEPENDENT_AMBULATORY_CARE_PROVIDER_SITE_OTHER): Payer: 59 | Admitting: Gastroenterology

## 2016-12-10 VITALS — BP 111/65 | HR 64 | Temp 98.4°F | Ht 71.0 in | Wt 217.0 lb

## 2016-12-10 DIAGNOSIS — K580 Irritable bowel syndrome with diarrhea: Secondary | ICD-10-CM

## 2016-12-10 DIAGNOSIS — R197 Diarrhea, unspecified: Secondary | ICD-10-CM | POA: Diagnosis not present

## 2016-12-10 DIAGNOSIS — R634 Abnormal weight loss: Secondary | ICD-10-CM | POA: Diagnosis not present

## 2016-12-10 MED ORDER — PEG 3350-KCL-NA BICARB-NACL 420 G PO SOLR: 4000.0000 mL | Freq: Once | ORAL | 0 refills | Status: AC

## 2016-12-10 NOTE — Progress Notes (Signed)
Gastroenterology Consultation  Referring Provider:     Coral Spikes, DO Primary Care Physician:  Mikey College, NP Primary Gastroenterologist:  Dr. Allen Norris     Reason for Consultation:     Nausea with diarrhea        HPI:   Jacob Andrews is a 31 y.o. y/o male referred for consultation & management of Nausea with diarrhea by Dr. Merrilyn Puma, Jerrel Ivory, NP.  This patient comes in today with a long-standing history of nausea with diarrhea.  Been told in the past that he has irritable bowel syndrome. The patient takes dicyclomine by reports that did not help greatly.  He also takes Zofran. He reports that he has 3 watery bowel movements a day and does not usually have the diarrhea wake him up from sleeping.  The patient also reports his lost approximately 20 pounds without trying.  He denies any family history of colon cancer colon polyps.  The patient also denies any black stools or bloody stools.  He reports the symptoms are worse with his stress level. He reports that the vomiting is typically with the diarrhea and usually in the morning.  The patient reports that the Bentyl helps but only for about 30 minutes.  The patient also reports that he believes that most of his symptoms started after having his appendix out. The patient also reports that he has had some improvement with Citrucel.  Past Medical History:  Diagnosis Date  . Depression   . IBS (irritable bowel syndrome)   . Kidney stone   . OSA (obstructive sleep apnea)   . Vertigo     Past Surgical History:  Procedure Laterality Date  . APPENDECTOMY  2013  . KIDNEY SURGERY  2010  . URETERAL STENT PLACEMENT  2012    Prior to Admission medications   Medication Sig Start Date End Date Taking? Authorizing Provider  busPIRone (BUSPAR) 10 MG tablet Take 1 tablet (10 mg total) by mouth 2 (two) times daily. 11/27/16  Yes Mikey College, NP  dicyclomine (BENTYL) 20 MG tablet Take 1 tablet (20 mg total) by mouth 4 (four)  times daily -  before meals and at bedtime. 11/08/14  Yes Krebs, Amy Lauren, NP  fluticasone (FLONASE) 50 MCG/ACT nasal spray 2 sprays by Nasal route once daily. 07/09/16  Yes [provider]  ondansetron (ZOFRAN) 4 MG tablet Take 1 tablet (4 mg total) by mouth every 8 (eight) hours as needed for nausea or vomiting. 11/27/16  Yes Mikey College, NP  polyethylene glycol [facility]) packet Take 17 g by mouth daily. 11/27/16  Yes Mikey College, NP  promethazine (PHENERGAN) 25 MG tablet  09/28/16  Yes [provider]  polyethylene glycol-electrolytes (GAVILYTE-N WITH FLAVOR PACK) 420 g solution Take 4,000 mLs by mouth once. 12/10/16 12/10/16  Lucilla Lame, MD  traZODone (DESYREL) 50 MG tablet Take 1 tablet (50 mg total) by mouth at bedtime. Patient not taking: Reported on 12/10/2016 11/12/16   Mikey College, NP    Family History  Problem Relation Age of Onset  . Diabetes Father   . Hypertension Mother   . Heart disease Maternal Grandmother        heart attack  . Heart disease Paternal Grandfather   . Stroke Paternal Grandfather      Social History  Substance Use Topics  . Smoking status: Never Smoker  . Smokeless tobacco: Never Used  . Alcohol use No    Allergies as of 12/10/2016 -  Review Complete 12/10/2016  Allergen Reaction Noted  . Cinnamon Swelling 08/06/2016  . Oxycodone Other (See Comments) 11/08/2014    Review of Systems:    All systems reviewed and negative except where noted in HPI.   Physical Exam:  BP 111/65   Pulse 64   Temp 98.4 F (36.9 C) (Oral)   Ht 5\' 11"  (1.803 m)   Wt 217 lb (98.4 kg)   BMI 30.27 kg/m  No LMP for male patient. Psych:  Alert and cooperative. Normal mood and affect. General:   Alert,  Well-developed, well-nourished, pleasant and cooperative in NAD Head:  Normocephalic and atraumatic. Eyes:  Sclera clear, no icterus.   Conjunctiva pink. Ears:  Normal auditory acuity. Nose:  No deformity, discharge, or  lesions. Mouth:  No deformity or lesions,oropharynx pink & moist. Neck:  Supple; no masses or thyromegaly. Lungs:  Respirations even and unlabored.  Clear throughout to auscultation.   No wheezes, crackles, or rhonchi. No acute distress. Heart:  Regular rate and rhythm; no murmurs, clicks, rubs, or gallops. Abdomen:  Normal bowel sounds.  No bruits.  Soft, non-tender and non-distended without masses, hepatosplenomegaly or hernias noted.  No guarding or rebound tenderness.  Negative Carnett sign.   Rectal:  Deferred.  Msk:  Symmetrical without gross deformities.  Good, equal movement & strength bilaterally. Pulses:  Normal pulses noted. Extremities:  No clubbing or edema.  No cyanosis. Neurologic:  Alert and oriented x3;  grossly normal neurologically. Skin:  Intact without significant lesions or rashes.  No jaundice. Lymph Nodes:  No significant cervical adenopathy. Psych:  Alert and cooperative. Normal mood and affect.  Imaging Studies: Ct Abdomen Pelvis W Contrast  Result Date: 12/04/2016 CLINICAL DATA:  Irribtable bowel syndrome for 2 years. Vomiting. Prior right sided surgery for kidney stones. Appendectomy. EXAM: CT ABDOMEN AND PELVIS WITH CONTRAST TECHNIQUE: Multidetector CT imaging of the abdomen and pelvis was performed using the standard protocol following bolus administration of intravenous contrast. CONTRAST:  159mL ISOVUE-300 IOPAMIDOL (ISOVUE-300) INJECTION 61% COMPARISON:  10/15/2016 right upper quadrant ultrasound. 03/23/2012 abdominopelvic CT. FINDINGS: Lower chest: Clear lung bases. Normal heart size without pericardial or pleural effusion. Hepatobiliary: Minimal exclusion of the hepatic dome. Normal liver. Normal gallbladder, without biliary ductal dilatation. Pancreas: Normal, without mass or ductal dilatation. Spleen: Normal in size, without focal abnormality. Adrenals/Urinary Tract: Normal adrenal glands. Upper pole left renal 4.5 cm cyst. Minimal right caliectasis is  decreased. Minimal right hydroureter is also decreased. Interval passage of the proximal right ureteric stone. Subtle wall thickening involving the right renal pelvis on image 34/series 2. Normal urinary bladder. Stomach/Bowel: Normal stomach, without wall thickening. Normal colon and terminal ileum. Normal small bowel. Vascular/Lymphatic: Normal caliber of the aorta and branch vessels. No abdominopelvic adenopathy. Reproductive: Normal prostate. Other: No significant free fluid. Mild ventral abdominal wall laxity contains fat on image 39/ series 2. Musculoskeletal: Disc bulges at L4-5 and L5-S1. IMPRESSION: 1.  No acute process in the abdomen or pelvis. 2. Mild right-sided caliectasis and proximal hydroureter, slightly decreased since 03/23/2012. Interval passage of proximal right ureteric stone. Mild right renal pelvic wall thickening is chronic and most likely related to prior chronic obstruction. Electronically Signed   By: Abigail Miyamoto M.D.   On: 12/04/2016 10:05    Assessment and Plan:   Jacob Andrews is a 31 y.o. y/o male with a history of irritable bowel syndrome who now reports weight loss with his diarrhea and nausea and vomiting. The patient will be started on a  trial of Dexilant to be taken in the evening.  The patient will also be set up for an EGD and colonoscopy.  I'll also send the patient's stools for fecal fat and fecal leukocytes for possible malabsorption and inflammation.  The patient has been explained the plan and agrees with it.  Lucilla Lame, MD. Marval Regal   Note: This dictation was prepared with Dragon dictation along with smaller phrase technology. Any transcriptional errors that result from this process are unintentional.

## 2016-12-25 ENCOUNTER — Encounter: Payer: Self-pay | Admitting: *Deleted

## 2016-12-26 NOTE — Discharge Instructions (Signed)
General Anesthesia, Adult, Care After °These instructions provide you with information about caring for yourself after your procedure. Your health care provider may also give you more specific instructions. Your treatment has been planned according to current medical practices, but problems sometimes occur. Call your health care provider if you have any problems or questions after your procedure. °What can I expect after the procedure? °After the procedure, it is common to have: °· Vomiting. °· A sore throat. °· Mental slowness. ° °It is common to feel: °· Nauseous. °· Cold or shivery. °· Sleepy. °· Tired. °· Sore or achy, even in parts of your body where you did not have surgery. ° °Follow these instructions at home: °For at least 24 hours after the procedure: °· Do not: °? Participate in activities where you could fall or become injured. °? Drive. °? Use heavy machinery. °? Drink alcohol. °? Take sleeping pills or medicines that cause drowsiness. °? Make important decisions or sign legal documents. °? Take care of children on your own. °· Rest. °Eating and drinking °· If you vomit, drink water, juice, or soup when you can drink without vomiting. °· Drink enough fluid to keep your urine clear or pale yellow. °· Make sure you have little or no nausea before eating solid foods. °· Follow the diet recommended by your health care provider. °General instructions °· Have a responsible adult stay with you until you are awake and alert. °· Return to your normal activities as told by your health care provider. Ask your health care provider what activities are safe for you. °· Take over-the-counter and prescription medicines only as told by your health care provider. °· If you smoke, do not smoke without supervision. °· Keep all follow-up visits as told by your health care provider. This is important. °Contact a health care provider if: °· You continue to have nausea or vomiting at home, and medicines are not helpful. °· You  cannot drink fluids or start eating again. °· You cannot urinate after 8-12 hours. °· You develop a skin rash. °· You have fever. °· You have increasing redness at the site of your procedure. °Get help right away if: °· You have difficulty breathing. °· You have chest pain. °· You have unexpected bleeding. °· You feel that you are having a life-threatening or urgent problem. °This information is not intended to replace advice given to you by your health care provider. Make sure you discuss any questions you have with your health care provider. °Document Released: 07/09/2000 Document Revised: 09/05/2015 Document Reviewed: 03/17/2015 °Elsevier Interactive Patient Education © 2018 [employer] Inc[employer] ° °

## 2016-12-31 ENCOUNTER — Ambulatory Visit
Admission: RE | Admit: 2016-12-31 | Discharge: 2016-12-31 | Disposition: A | Discharge status: Home / Self Care | Payer: 59 | Source: Ambulatory Visit | Attending: Gastroenterology | Admitting: Gastroenterology

## 2016-12-31 ENCOUNTER — Encounter
Admission: RE | Disposition: A | Discharge status: Home / Self Care | Payer: Self-pay | Source: Ambulatory Visit | Attending: Gastroenterology

## 2016-12-31 ENCOUNTER — Ambulatory Visit: Payer: 59 | Admitting: Anesthesiology

## 2016-12-31 DIAGNOSIS — Z950 Presence of cardiac pacemaker: Secondary | ICD-10-CM | POA: Diagnosis not present

## 2016-12-31 DIAGNOSIS — K529 Noninfective gastroenteritis and colitis, unspecified: Secondary | ICD-10-CM | POA: Diagnosis not present

## 2016-12-31 DIAGNOSIS — F329 Major depressive disorder, single episode, unspecified: Secondary | ICD-10-CM | POA: Diagnosis not present

## 2016-12-31 DIAGNOSIS — D125 Benign neoplasm of sigmoid colon: Secondary | ICD-10-CM

## 2016-12-31 DIAGNOSIS — G4733 Obstructive sleep apnea (adult) (pediatric): Secondary | ICD-10-CM | POA: Diagnosis not present

## 2016-12-31 DIAGNOSIS — K635 Polyp of colon: Secondary | ICD-10-CM

## 2016-12-31 DIAGNOSIS — Z885 Allergy status to narcotic agent status: Secondary | ICD-10-CM | POA: Diagnosis not present

## 2016-12-31 DIAGNOSIS — D123 Benign neoplasm of transverse colon: Secondary | ICD-10-CM

## 2016-12-31 DIAGNOSIS — F419 Anxiety disorder, unspecified: Secondary | ICD-10-CM | POA: Diagnosis not present

## 2016-12-31 DIAGNOSIS — Z87891 Personal history of nicotine dependence: Secondary | ICD-10-CM | POA: Diagnosis not present

## 2016-12-31 DIAGNOSIS — K219 Gastro-esophageal reflux disease without esophagitis: Secondary | ICD-10-CM | POA: Diagnosis not present

## 2016-12-31 DIAGNOSIS — K589 Irritable bowel syndrome without diarrhea: Secondary | ICD-10-CM | POA: Diagnosis not present

## 2016-12-31 DIAGNOSIS — Z87442 Personal history of urinary calculi: Secondary | ICD-10-CM | POA: Insufficient documentation

## 2016-12-31 DIAGNOSIS — Z888 Allergy status to other drugs, medicaments and biological substances status: Secondary | ICD-10-CM | POA: Insufficient documentation

## 2016-12-31 HISTORY — PX: POLYPECTOMY: SHX5525

## 2016-12-31 HISTORY — PX: COLONOSCOPY WITH PROPOFOL: SHX5780

## 2016-12-31 HISTORY — DX: Enthesopathy, unspecified: M77.9

## 2016-12-31 SURGERY — COLONOSCOPY WITH PROPOFOL
Anesthesia: General | Wound class: Contaminated

## 2016-12-31 MED ORDER — ACETAMINOPHEN 325 MG PO TABS: 325.0000 mg | ORAL_TABLET | ORAL | Status: DC | PRN

## 2016-12-31 MED ORDER — ONDANSETRON HCL 4 MG/2ML IJ SOLN: 4.0000 mg | Freq: Once | INTRAMUSCULAR | Status: DC | PRN

## 2016-12-31 MED ORDER — LACTATED RINGERS IV SOLN
10.0000 mL/h | INTRAVENOUS | Status: DC
  Administered 2016-12-31: 10 mL/h via INTRAVENOUS

## 2016-12-31 MED ORDER — ACETAMINOPHEN 160 MG/5ML PO SOLN: 325.0000 mg | ORAL | Status: DC | PRN

## 2016-12-31 MED ORDER — DEXLANSOPRAZOLE 60 MG PO CPDR: 60.0000 mg | DELAYED_RELEASE_CAPSULE | Freq: Every day | ORAL | 11 refills | Status: DC

## 2016-12-31 MED ORDER — LIDOCAINE HCL (CARDIAC) 20 MG/ML IV SOLN
INTRAVENOUS | Status: DC | PRN
  Administered 2016-12-31: 50 mg via INTRAVENOUS

## 2016-12-31 MED ORDER — SIMETHICONE 40 MG/0.6ML PO SUSP
ORAL | Status: DC | PRN
  Administered 2016-12-31: 09:00:00

## 2016-12-31 MED ORDER — PROPOFOL 10 MG/ML IV BOLUS
INTRAVENOUS | Status: DC | PRN
  Administered 2016-12-31 (×2): 20 mg via INTRAVENOUS
  Administered 2016-12-31: 30 mg via INTRAVENOUS
  Administered 2016-12-31: 50 mg via INTRAVENOUS
  Administered 2016-12-31 (×3): 20 mg via INTRAVENOUS
  Administered 2016-12-31: 100 mg via INTRAVENOUS
  Administered 2016-12-31 (×2): 30 mg via INTRAVENOUS

## 2016-12-31 SURGICAL SUPPLY — 23 items

## 2016-12-31 NOTE — Transfer of Care (Signed)
Immediate Anesthesia Transfer of Care Note  Patient: Jacob Andrews  Procedure(s) Performed: Procedure(s) with comments: COLONOSCOPY WITH PROPOFOL (N/A) - sleep apnea POLYPECTOMY  Patient Location: PACU  Anesthesia Type: General  Level of Consciousness: awake, alert  and patient cooperative  Airway and Oxygen Therapy: Patient Spontanous Breathing and Patient connected to supplemental oxygen  Post-op Assessment: Post-op Vital signs reviewed, Patient's Cardiovascular Status Stable, Respiratory Function Stable, Patent Airway and No signs of Nausea or vomiting  Post-op Vital Signs: Reviewed and stable  Complications: No apparent anesthesia complications

## 2016-12-31 NOTE — Anesthesia Postprocedure Evaluation (Signed)
Anesthesia Post Note  Patient: Jacob Andrews  Procedure(s) Performed: Procedure(s) (LRB): COLONOSCOPY WITH PROPOFOL (N/A) POLYPECTOMY  Patient location during evaluation: PACU Anesthesia Type: General Level of consciousness: awake and alert Pain management: pain level controlled Vital Signs Assessment: post-procedure vital signs reviewed and stable Respiratory status: spontaneous breathing, nonlabored ventilation, respiratory function stable and patient connected to nasal cannula oxygen Cardiovascular status: blood pressure returned to baseline and stable Postop Assessment: no apparent nausea or vomiting Anesthetic complications: no    Ondrea Dow

## 2016-12-31 NOTE — Anesthesia Preprocedure Evaluation (Addendum)
Anesthesia Evaluation  Patient identified by MRN, date of birth, ID band  Reviewed: NPO status Preop documentation limited or incomplete due to emergent nature of procedure.  History of Anesthesia Complications Negative for: history of anesthetic complications  Airway Mallampati: II  TM Distance: >3 FB Neck ROM: full    Dental no notable dental hx.    Pulmonary sleep apnea (no cpap) , former smoker,    Pulmonary exam normal        Cardiovascular Exercise Tolerance: Good negative cardio ROS Normal cardiovascular exam(-) pacemaker     Neuro/Psych Anxiety vertigo    GI/Hepatic Neg liver ROS, GERD  Medicated,ibs    Endo/Other  negative endocrine ROS  Renal/GU Renal disease (stones)  negative genitourinary   Musculoskeletal   Abdominal   Peds  Hematology negative hematology ROS (+)   Anesthesia Other Findings tiva  Reproductive/Obstetrics                           Anesthesia Physical Anesthesia Plan  ASA: II  Anesthesia Plan: General   Post-op Pain Management:    Induction:   PONV Risk Score and Plan:   Airway Management Planned:   Additional Equipment:   Intra-op Plan:   Post-operative Plan:   Informed Consent: I have reviewed the patients History and Physical, chart, labs and discussed the procedure including the risks, benefits and alternatives for the proposed anesthesia with the patient or authorized representative who has indicated his/her understanding and acceptance.     Plan Discussed with: CRNA  Anesthesia Plan Comments:        Anesthesia Quick Evaluation

## 2016-12-31 NOTE — Anesthesia Procedure Notes (Signed)
Date/Time: 12/31/2016 9:03 AM Performed by: Cameron Ali Pre-anesthesia Checklist: Patient identified, Emergency Drugs available, Suction available, Timeout performed and Patient being monitored Patient Re-evaluated:Patient Re-evaluated prior to induction Oxygen Delivery Method: Nasal cannula Placement Confirmation: positive ETCO2

## 2016-12-31 NOTE — Op Note (Signed)
[facility] Gastroenterology Patient Name: Jacob Andrews Procedure Date: 12/31/2016 9:02 AM MRN: [id number] Account #: [id number] Date of Birth: Feb 03, 1986 Admit Type: Outpatient Age: 31 Room: [facility] OR ROOM 01 Gender: Male Note Status: Finalized Procedure:            Colonoscopy Indications:          Chronic diarrhea Providers:            Lucilla Lame MD, MD Referring MD:         Olin Hauser (Referring MD) Medicines:            Propofol per Anesthesia Complications:        No immediate complications. Procedure:            Pre-Anesthesia Assessment:                       - Prior to the procedure, a History and Physical was                        performed, and patient medications and allergies were                        reviewed. The patient's tolerance of previous                        anesthesia was also reviewed. The risks and benefits of                        the procedure and the sedation options and risks were                        discussed with the patient. All questions were                        answered, and informed consent was obtained. Prior                        Anticoagulants: The patient has taken no previous                        anticoagulant or antiplatelet agents. ASA Grade                        Assessment: II - A patient with mild systemic disease.                        After reviewing the risks and benefits, the patient was                        deemed in satisfactory condition to undergo the                        procedure.                       After obtaining informed consent, the colonoscope was                        passed under direct vision. Throughout the procedure,  the patient's blood pressure, pulse, and oxygen                        saturations were monitored continuously. The Olympus                        CF-HQ190L Colonoscope (S#. [id number]) was introduced   through the anus and advanced to the the terminal                        ileum. The colonoscopy was performed without                        difficulty. The patient tolerated the procedure well.                        The quality of the bowel preparation was excellent. Findings:      The perianal and digital rectal examinations were normal.      A localized area of mucosa in the terminal ileum was mildly       erythematous. Biopsies were taken with a cold forceps for histology.      A 3 mm polyp was found in the transverse colon. The polyp was sessile.       The polyp was removed with a cold biopsy forceps. Resection and       retrieval were complete.      Three sessile polyps were found in the sigmoid colon. The polyps were 2       to 3 mm in size.      Random biopsies were obtained with cold forceps for histology randomly       in the entire colon. Impression:           - Erythematous mucosa in the terminal ileum. Biopsied.                       - One 3 mm polyp in the transverse colon, removed with                        a cold biopsy forceps. Resected and retrieved.                       - Three 2 to 3 mm polyps in the sigmoid colon.                       - Random biopsies were obtained in the entire colon. Recommendation:       - Discharge patient to home.                       - Resume previous diet.                       - Continue present medications.                       - Await pathology results.                       - Repeat colonoscopy in 5 years if polyp adenoma. Procedure Code(s):    --- Professional ---  45380, Colonoscopy, flexible; with biopsy, single or                        multiple Diagnosis Code(s):    --- Professional ---                       K52.9, Noninfective gastroenteritis and colitis,                        unspecified                       D12.5, Benign neoplasm of sigmoid colon                       D12.3, Benign neoplasm of  transverse colon (hepatic                        flexure or splenic flexure) CPT copyright 2016 American Medical Association. All rights reserved. The codes documented in this report are preliminary and upon coder review may  be revised to meet current compliance requirements. Lucilla Lame MD, MD 12/31/2016 9:22:29 AM This report has been signed electronically. Number of Addenda: 0 Note Initiated On: 12/31/2016 9:02 AM Scope Withdrawal Time: 0 hours 6 minutes 40 seconds  Total Procedure Duration: 0 hours 9 minutes 57 seconds       [facility]

## 2016-12-31 NOTE — H&P (Signed)
Lucilla Lame, MD [facility] [address] [place], [place] [address] Phone:[phone] Fax : [phone]  Primary Care Physician:  Mikey College, NP Primary Gastroenterologist:  Dr. Allen Norris  Pre-Procedure History & Physical: HPI:  Jacob Andrews is a 31 y.o. male is here for an colonoscopy.   Past Medical History:  Diagnosis Date  . Bone spur    right knee  . Depression   . IBS (irritable bowel syndrome)   . Kidney stone    current stone outside kidney after surgery.   . OSA (obstructive sleep apnea)    no CPAP- could not get "adjusted" correctly  . Vertigo     Past Surgical History:  Procedure Laterality Date  . APPENDECTOMY  2013  . KIDNEY SURGERY  2010  . URETERAL STENT PLACEMENT  2012    Prior to Admission medications   Medication Sig Start Date End Date Taking? Authorizing Provider  busPIRone (BUSPAR) 10 MG tablet Take 1 tablet (10 mg total) by mouth 2 (two) times daily. 11/27/16  Yes Mikey College, NP  dexlansoprazole (DEXILANT) 60 MG capsule Take 60 mg by mouth daily.   Yes [provider]  fluticasone (FLONASE) 50 MCG/ACT nasal spray 2 sprays by Nasal route once daily. 07/09/16  Yes [provider]  meclizine (ANTIVERT) 25 MG tablet Take 25 mg by mouth 3 (three) times daily as needed for dizziness.   Yes [provider]  ondansetron (ZOFRAN) 4 MG tablet Take 1 tablet (4 mg total) by mouth every 8 (eight) hours as needed for nausea or vomiting. 11/27/16  Yes Mikey College, NP  polyethylene glycol [facility]) packet Take 17 g by mouth daily. 11/27/16  Yes Mikey College, NP  promethazine (PHENERGAN) 25 MG tablet  09/28/16  Yes [provider]  traZODone (DESYREL) 50 MG tablet Take 1 tablet (50 mg total) by mouth at bedtime. 11/12/16  Yes Mikey College, NP  dicyclomine (BENTYL) 20 MG tablet Take 1 tablet (20 mg total) by mouth 4 (four) times daily -  before meals and at bedtime. Patient not taking:  Reported on 12/25/2016 11/08/14   Luciana Axe, NP    Allergies as of 12/10/2016 - Review Complete 12/10/2016  Allergen Reaction Noted  . Cinnamon Swelling 08/06/2016  . Oxycodone Other (See Comments) 11/08/2014    Family History  Problem Relation Age of Onset  . Diabetes Father   . Hypertension Mother   . Heart disease Maternal Grandmother        heart attack  . Heart disease Paternal Grandfather   . Stroke Paternal Grandfather     Social History   Social History  . Marital status: Single    Spouse name: N/A  . Number of children: N/A  . Years of education: N/A   Occupational History  . Not on file.   Social History Main Topics  . Smoking status: Former Smoker    Quit date: 2012  . Smokeless tobacco: Never Used  . Alcohol use No  . Drug use: Yes    Types: Marijuana  . Sexual activity: Yes    Partners: Female     Comment: partner is on the pill.    Other Topics Concern  . Not on file   Social History Narrative  . No narrative on file    Review of Systems: See HPI, otherwise negative ROS  Physical Exam: BP 126/77   Pulse 87   Resp 16   Ht 5\' 11"  (1.803 m)  Wt 213 lb (96.6 kg)   SpO2 98%   BMI 29.71 kg/m  General:   Alert,  pleasant and cooperative in NAD Head:  Normocephalic and atraumatic. Neck:  Supple; no masses or thyromegaly. Lungs:  Clear throughout to auscultation.    Heart:  Regular rate and rhythm. Abdomen:  Soft, nontender and nondistended. Normal bowel sounds, without guarding, and without rebound.   Neurologic:  Alert and  oriented x4;  grossly normal neurologically.  Impression/Plan: Jacob Andrews is here for an colonoscopy to be performed for diarrhea  Risks, benefits, limitations, and alternatives regarding  colonoscopy have been reviewed with the patient.  Questions have been answered.  All parties agreeable.   Lucilla Lame, MD  12/31/2016, 8:23 AM

## 2017-01-01 ENCOUNTER — Encounter: Payer: Self-pay | Admitting: Gastroenterology

## 2017-01-03 ENCOUNTER — Telehealth: Payer: Self-pay | Admitting: Gastroenterology

## 2017-01-03 NOTE — Telephone Encounter (Signed)
Prior authorization has already been done this morning. Waiting on response.

## 2017-01-03 NOTE — Telephone Encounter (Signed)
Patient needs prior auth for Dexilant. [place]

## 2017-01-08 ENCOUNTER — Encounter: Payer: Self-pay | Admitting: Gastroenterology

## 2017-01-11 ENCOUNTER — Telehealth: Payer: Self-pay

## 2017-01-11 NOTE — Telephone Encounter (Signed)
Tried contacting pt but number listed was not working. Left vm with Dorian Pod for pt to return my call to schedule follow up appt.

## 2017-01-11 NOTE — Telephone Encounter (Signed)
-----   Message from Lucilla Lame, MD sent at 01/09/2017  8:01 AM EDT ----- Please have the patient come in for a follow up.

## 2017-01-11 NOTE — Telephone Encounter (Signed)
Pt returned call and scheduled a follow up appt.

## 2017-01-14 ENCOUNTER — Ambulatory Visit (INDEPENDENT_AMBULATORY_CARE_PROVIDER_SITE_OTHER): Payer: 59 | Admitting: Nurse Practitioner

## 2017-01-14 ENCOUNTER — Encounter: Payer: Self-pay | Admitting: Nurse Practitioner

## 2017-01-14 VITALS — BP 133/72 | HR 90 | Temp 98.2°F | Ht 71.0 in | Wt 223.2 lb

## 2017-01-14 DIAGNOSIS — F3289 Other specified depressive episodes: Secondary | ICD-10-CM | POA: Diagnosis not present

## 2017-01-14 DIAGNOSIS — R42 Dizziness and giddiness: Secondary | ICD-10-CM

## 2017-01-14 DIAGNOSIS — Z23 Encounter for immunization: Secondary | ICD-10-CM

## 2017-01-14 DIAGNOSIS — F419 Anxiety disorder, unspecified: Secondary | ICD-10-CM | POA: Diagnosis not present

## 2017-01-14 MED ORDER — ESCITALOPRAM OXALATE 10 MG PO TABS: 5.0000 mg | ORAL_TABLET | Freq: Every day | ORAL | 2 refills | Status: DC

## 2017-01-14 MED ORDER — ESCITALOPRAM OXALATE 10 MG PO TABS
10.0000 mg | ORAL_TABLET | Freq: Every day | ORAL | 2 refills | Status: DC
Start: 2017-01-14 — End: 2017-03-18

## 2017-01-14 MED ORDER — MECLIZINE HCL 25 MG PO TABS: 25.0000 mg | ORAL_TABLET | Freq: Three times a day (TID) | ORAL | 1 refills | Status: DC | PRN

## 2017-01-14 NOTE — Assessment & Plan Note (Signed)
See A&P for depression

## 2017-01-14 NOTE — Assessment & Plan Note (Addendum)
Again is improved on medications trazodone and buspirone, however is not tolerating drowsiness a/w trazodone.  No SI/HI or plans to carry out.  PHQ9 and GAD7 not improved over last assessment, however drowsiness is going to increase scores from secondary causes.  Plan: 1. Encouraged counseling. 2. Continue buspirone 10 mg bid (total increase 5 mg per day). 3. STOP trazodone 50 mg at bedtime 2/2 drowsiness. - START escitalopram 10 mg once daily. Take 1/2 tablet once daily x 1 week.  Then, increase to 10 mg once daily. 4. Encouraged regular exercise. 5. Followup 2 months.

## 2017-01-14 NOTE — Patient Instructions (Addendum)
Margues, Thank you for coming in to clinic today.  1. For moods: - STOP trazodone - START escitalopram 10 mg tablet.  Take 1/2 tablet for 1-2 weeks.  Then increase to full tablet once daily and continue.  2. Dizziness - Continue meclizine once tablet up to three times daily as needed.  Please schedule a follow-up appointment with [name], AGNP. Return in about 2 months (around 03/16/2017) for anxiety and depression.  If you have any other questions or concerns, please feel free to call the clinic or send a message through [place]. You may also schedule an earlier appointment if necessary.  You will receive a survey after today's visit either digitally by e-mail or paper by [employer]. Your experiences and feedback matter to [place].  Please respond so we know how we are doing as we provide care for you.   [name], DNP, AGNP-BC Adult Gerontology Nurse Practitioner [place]

## 2017-01-14 NOTE — Progress Notes (Signed)
Subjective:    Patient ID: Jacob Andrews, male    DOB: 01-04-1986, 31 y.o.   MRN: [id number]  Jacob Andrews is a 31 y.o. male presenting on 01/14/2017 for Anxiety (Depression)   HPI  Anxiety and depression Trazodone is making him very groggy and has overslept 2x, but feels better on on this medication w/ noticeable improvement in moods.  - Has returned to work and has had increased stress, but is still able to deal with GI difficulties w/o incident.  Has f/u appointment w/ Dr. Allen Norris in about 2 weeks.  Dizziness Still having occasional dizziness, but not daily.  Meclizine is helping and only needs one tablet when he has dizziness. Would like refill today.  Social History  Substance Use Topics  . Smoking status: Former Smoker    Quit date: 2012  . Smokeless tobacco: Never Used  . Alcohol use No    Review of Systems Per HPI unless specifically indicated above     Objective:    BP 133/72 (BP Location: Right Arm, Patient Position: Sitting, Cuff Size: Normal)   Pulse 90   Temp 98.2 F (36.8 C) (Oral)   Ht 5\' 11"  (1.803 m)   Wt 223 lb 3.2 oz (101.2 kg)   BMI 31.13 kg/m    Wt Readings from Last 3 Encounters:  01/14/17 223 lb 3.2 oz (101.2 kg)  12/31/16 213 lb (96.6 kg)  12/10/16 217 lb (98.4 kg)     Physical Exam  General - healthy, well-appearing, NAD HEENT - Normocephalic, atraumatic Heart - RRR, no murmurs heard Lungs - Clear throughout all lobes, no wheezing, crackles, or rhonchi. Normal work of breathing. Extremeties - non-tender, no edema, cap refill < 2 seconds, peripheral pulses intact +2 bilaterally Skin - warm, dry Neuro - awake, alert, oriented x3, normal gait Psych - Normal mood and affect, normal behavior    Results for orders placed or performed in visit on 10/09/16  CBC  Result Value Ref Range   WBC 6.6 4.0 - 10.5 K/uL   RBC 4.83 4.22 - 5.81 Mil/uL   Platelets 305.0 150.0 - 400.0 K/uL   Hemoglobin 15.3 13.0 - 17.0 g/dL   HCT 44.1 39.0 - 52.0 %     MCV 91.4 78.0 - 100.0 fl   MCHC 34.6 30.0 - 36.0 g/dL   RDW 13.1 11.5 - 15.5 %  Comprehensive metabolic panel  Result Value Ref Range   Sodium 140 135 - 145 mEq/L   Potassium 4.4 3.5 - 5.1 mEq/L   Chloride 104 96 - 112 mEq/L   CO2 28 19 - 32 mEq/L   Glucose, Bld 90 70 - 99 mg/dL   BUN 11 6 - 23 mg/dL   Creatinine, Ser 0.80 0.40 - 1.50 mg/dL   Total Bilirubin 0.6 0.2 - 1.2 mg/dL   Alkaline Phosphatase 57 39 - 117 U/L   AST 12 0 - 37 U/L   ALT 17 0 - 53 U/L   Total Protein 7.2 6.0 - 8.3 g/dL   Albumin 4.5 3.5 - 5.2 g/dL   Calcium 9.6 8.4 - 10.5 mg/dL   GFR 120.94 >60.00 mL/min  Lipid panel  Result Value Ref Range   Cholesterol 178 0 - 200 mg/dL   Triglycerides 150.0 (H) 0.0 - 149.0 mg/dL   HDL 31.20 (L) >39.00 mg/dL   VLDL 30.0 0.0 - 40.0 mg/dL   LDL Cholesterol 117 (H) 0 - 99 mg/dL   Total CHOL/HDL Ratio 6    NonHDL 147.29  Assessment & Plan:   Problem List Items Addressed This Visit      Other   Depression    Again is improved on medications trazodone and buspirone, however is not tolerating drowsiness a/w trazodone.  No SI/HI or plans to carry out.  PHQ9 and GAD7 not improved over last assessment, however drowsiness is going to increase scores from secondary causes.  Plan: 1. Encouraged counseling. 2. Continue buspirone 10 mg bid (total increase 5 mg per day). 3. STOP trazodone 50 mg at bedtime 2/2 drowsiness. - START escitalopram 10 mg once daily. Take 1/2 tablet once daily x 1 week.  Then, increase to 10 mg once daily. 4. Encouraged regular exercise. 5. Followup 2 months.       Relevant Medications   escitalopram (LEXAPRO) 10 MG tablet   Anxiety    See A/P for depression.      Relevant Medications   escitalopram (LEXAPRO) 10 MG tablet    Other Visit Diagnoses    Needs flu shot    -  Primary Pt needs annual influenza vaccine.  Administer quadrivalent flu today.   Relevant Orders   Flu Vaccine QUAD 6+ mos PF IM (Fluarix Quad PF) (Completed)    Dizziness     Occasional and intermittent.  Has symptom relief w/ meclizine and only requires one tablet once daily if needed.    Plan: 1. Continue meclizine 25 mg up to three times daily as needed for dizziness.  Refill provided, but expect will improve over time and can decrease medications.   Relevant Medications   meclizine (ANTIVERT) 25 MG tablet   Need for tetanus, diphtheria, and acellular pertussis (Tdap) vaccine     Pt needs tetanus vaccine.  > 10 years since last vaccination.  Plan: 1. Reviewed tetanus disease and need for vaccination. 2. Administer vaccine today.   Relevant Orders   Tdap vaccine greater than or equal to 7yo IM (Completed)      Meds ordered this encounter  Medications  . meclizine (ANTIVERT) 25 MG tablet    Sig: Take 1 tablet (25 mg total) by mouth 3 (three) times daily as needed for dizziness.    Dispense:  30 tablet    Refill:  1  . escitalopram (LEXAPRO) 10 MG tablet    Sig: Take 1 tablet (10 mg total) by mouth daily.    Dispense:  30 tablet    Refill:  2    Follow up plan: Return in about 2 months (around 03/16/2017) for anxiety and depression.   [name], DNP, AGPCNP-BC Adult Gerontology Primary Care Nurse Practitioner [facility] 01/14/2017, 12:56 PM

## 2017-01-21 ENCOUNTER — Ambulatory Visit (INDEPENDENT_AMBULATORY_CARE_PROVIDER_SITE_OTHER): Payer: 59 | Admitting: Gastroenterology

## 2017-01-21 ENCOUNTER — Encounter: Payer: Self-pay | Admitting: Gastroenterology

## 2017-01-21 VITALS — BP 123/67 | HR 74 | Temp 98.7°F | Ht 71.0 in | Wt 222.0 lb

## 2017-01-21 DIAGNOSIS — R197 Diarrhea, unspecified: Secondary | ICD-10-CM

## 2017-01-21 DIAGNOSIS — R110 Nausea: Secondary | ICD-10-CM

## 2017-01-21 MED ORDER — PANTOPRAZOLE SODIUM 40 MG PO TBEC
40.0000 mg | DELAYED_RELEASE_TABLET | Freq: Every day | ORAL | 11 refills | Status: DC
Start: 2017-01-21 — End: 2018-05-06

## 2017-01-21 NOTE — Progress Notes (Signed)
Primary Care Physician: Mikey College, NP  Primary Gastroenterologist:  Dr. Lucilla Andrews  No chief complaint on file.   HPI: Jacob Andrews is a 31 y.o. male here for follow-up after having a colonoscopy for diarrhea weight loss. The patient had biopsies throughout the terminal ileum and throughout the colon that did not show any cause for his diarrhea weight loss. The patient has a prosthetic 3 bowel movements a day that are watery without any diarrhea waking him up from a sound sleep. The patient was given Dexilant samples at the last office visit. The patient states that the Dexilant was very expensive and he did not have her refill.  The patient's nausea was better on the Dexilant.  He is now taking fiber for his diarrhea but reports that he still has 3 loose bowel movements a day.  One of the polyps removed from his transverse colon showed adenomatous changes.  Current Outpatient Prescriptions  Medication Sig Dispense Refill  . busPIRone (BUSPAR) 10 MG tablet Take 1 tablet (10 mg total) by mouth 2 (two) times daily. 60 tablet 5  . dexlansoprazole (DEXILANT) 60 MG capsule Take 1 capsule (60 mg total) by mouth daily. (Patient not taking: Reported on 01/14/2017) 30 capsule 11  . dicyclomine (BENTYL) 20 MG tablet Take 1 tablet (20 mg total) by mouth 4 (four) times daily -  before meals and at bedtime. (Patient not taking: Reported on 01/14/2017) 120 tablet 11  . escitalopram (LEXAPRO) 10 MG tablet Take 1 tablet (10 mg total) by mouth daily. 30 tablet 2  . fluticasone (FLONASE) 50 MCG/ACT nasal spray 2 sprays by Nasal route once daily.    . meclizine (ANTIVERT) 25 MG tablet Take 1 tablet (25 mg total) by mouth 3 (three) times daily as needed for dizziness. 30 tablet 1  . ondansetron (ZOFRAN) 4 MG tablet Take 1 tablet (4 mg total) by mouth every 8 (eight) hours as needed for nausea or vomiting. 90 tablet 1  . polyethylene glycol (MIRALAX) packet Take 17 g by mouth daily. (Patient not  taking: Reported on 01/14/2017) 14 each 0  . promethazine (PHENERGAN) 25 MG tablet     . traZODone (DESYREL) 50 MG tablet   1   No current facility-administered medications for this visit.     Allergies as of 01/21/2017 - Review Complete 01/14/2017  Allergen Reaction Noted  . Cinnamon Swelling 08/06/2016  . Oxycodone Other (See Comments) 11/08/2014    ROS:  General: Negative for anorexia, weight loss, fever, chills, fatigue, weakness. ENT: Negative for hoarseness, difficulty swallowing , nasal congestion. CV: Negative for chest pain, angina, palpitations, dyspnea on exertion, peripheral edema.  Respiratory: Negative for dyspnea at rest, dyspnea on exertion, cough, sputum, wheezing.  GI: See history of present illness. GU:  Negative for dysuria, hematuria, urinary incontinence, urinary frequency, nocturnal urination.  Endo: Negative for unusual weight change.    Physical Examination:   There were no vitals taken for this visit.  General: Well-nourished, well-developed in no acute distress.  Eyes: No icterus. Conjunctivae pink. Extremities: No lower extremity edema. No clubbing or deformities. Neuro: Alert and oriented x 3.  Grossly intact. Skin: Warm and dry, no jaundice.   Psych: Alert and cooperative, normal mood and affect.  Labs:    Imaging Studies: No results found.  Assessment and Plan:   Jacob Andrews is a 31 y.o. y/o male with nausea that was made better on Dexilant.  The patient states that his insurance would not  cover the [place].  The patient will be started on Protonix once a day. The patient's colonoscopy did not show a cause for him to have diarrhea and his diarrhea most likely represents irritable bowel syndrome.  The patient has been told to take Imodium to help solidify his stools and see if that helps.    Jacob Lame, MD. Mar[facility] Regal   Note: This dictation was prepared with Dragon dictation along with smaller phrase technology. Any transcriptional errors  that result from this process are unintentional.

## 2017-03-18 ENCOUNTER — Ambulatory Visit (INDEPENDENT_AMBULATORY_CARE_PROVIDER_SITE_OTHER): Payer: 59 | Admitting: Nurse Practitioner

## 2017-03-18 ENCOUNTER — Other Ambulatory Visit: Payer: Self-pay

## 2017-03-18 ENCOUNTER — Encounter: Payer: Self-pay | Admitting: Nurse Practitioner

## 2017-03-18 VITALS — BP 135/71 | HR 76 | Ht 71.0 in | Wt 229.4 lb

## 2017-03-18 DIAGNOSIS — F3289 Other specified depressive episodes: Secondary | ICD-10-CM

## 2017-03-18 DIAGNOSIS — F321 Major depressive disorder, single episode, moderate: Secondary | ICD-10-CM

## 2017-03-18 DIAGNOSIS — F419 Anxiety disorder, unspecified: Secondary | ICD-10-CM | POA: Diagnosis not present

## 2017-03-18 DIAGNOSIS — K580 Irritable bowel syndrome with diarrhea: Secondary | ICD-10-CM

## 2017-03-18 MED ORDER — BUSPIRONE HCL 10 MG PO TABS: 10.0000 mg | ORAL_TABLET | Freq: Two times a day (BID) | ORAL | 1 refills | Status: DC

## 2017-03-18 MED ORDER — ESCITALOPRAM OXALATE 10 MG PO TABS: 10.0000 mg | ORAL_TABLET | Freq: Every day | ORAL | 1 refills | Status: DC

## 2017-03-18 MED ORDER — TRAZODONE HCL 50 MG PO TABS: 50.0000 mg | ORAL_TABLET | Freq: Every day | ORAL | 1 refills | Status: DC

## 2017-03-18 NOTE — Assessment & Plan Note (Signed)
See AP depression 

## 2017-03-18 NOTE — Assessment & Plan Note (Signed)
Pt notes moderate improvement w/ now tolerable symptoms usually only in am.  Pt also avoiding lactose more intentionally.  Anxiety management also a contributing factor to improvement.  Pt using immodium for symptom management once weekly.  Plan: 1. Continue immodium prn. 2. Continue food diary to identify any other food triggers. 3. Continue working on stress management strategies. 4. Followup 6 months and as needed.

## 2017-03-18 NOTE — Progress Notes (Signed)
Subjective:    Patient ID: Jacob Andrews, male    DOB: 20-May-1985, 31 y.o.   MRN: [id number]  Jacob Andrews is a 31 y.o. male presenting on 03/18/2017 for Anxiety   HPI  IBS Less diarrhea through the day.  No bentyl in several weeks and has not noticed any difference w/ this med, so does not wish to continue.    Miralax was stopped at appt w/ Dr. Allen Norris and started immodium instead.  Only takes immodium once per week when he feels more GI activity in am.  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  Also has had other significant reduction of life stressors that pt feels have helped his diarrhea symptoms.   Appetite has also improved w/ none for last 2 months unless overeating. Cut back significantly on dairy.  And is taking lactulose when eating it Food journal.  Anxiety and Depression Pt states he feels much improved on his current medications.  Pt taking buspirone 10 mg bid.  Pt continues to have improved sleep w/ trazodone 50 mg.  Still wakes about 4-5 x per week at 4:30 am for defecation, but is easily able to return to sleep most days. - Stress has been improved by using the following self management skills: "Forcing myself to calm down" - when he gets home, takes 5-10 minutes to let go of the day's stress.  - Pt notes resolution of some of the stressful situations that were causing severe symptoms several months ago as well.  GAD 7 : Generalized Anxiety Score 03/18/2017 03/18/2017 01/14/2017 11/27/2016  Nervous, Anxious, on Edge 1 1 2 1   Control/stop worrying 1 1 2 2   Worry too much - different things 1 1 3 1   Trouble relaxing 0 0 1 1  Restless 0 0 1 1  Easily annoyed or irritable 1 1 1 1   Afraid - awful might happen 0 0 0 1  Total GAD 7 Score 4 4 10 8   Anxiety Difficulty Not difficult at all Not difficult at all Somewhat difficult Somewhat difficult    Depression screen [facility] 2/9  03/18/2017 01/14/2017 11/27/2016 11/12/2016 12/20/2015  Decreased Interest 1 2 1 2 1   Down, Depressed, Hopeless 1 1 1 3 1   PHQ - 2 Score 2 3 2 5 2   Altered sleeping 0 2 2 2 2   Tired, decreased energy 1 2 2 2 1   Change in appetite 0 2 2 2 2   Feeling bad or failure about yourself  1 1 1 2 1   Trouble concentrating 0 1 1 1 1   Moving slowly or fidgety/restless 0 0 0 1 0  Suicidal thoughts - 0 0 1 0  PHQ-9 Score 4 11 10 16 9   Difficult doing work/chores Not difficult at all - - - -  Some recent data might be hidden     Social History   Tobacco Use  . Smoking status: Former Smoker    Last attempt to quit: 2012    Years since quitting: 6.9  . Smokeless tobacco: Never Used  Substance Use Topics  . Alcohol use: No    Alcohol/week: 0.0 oz  . Drug use: Yes    Types: Marijuana    Review of Systems Per HPI unless specifically indicated above     Objective:    BP 135/71 (BP Location: Right Arm, Patient Position: Sitting, Cuff Size: Normal)  Pulse 76   Ht 5\' 11"  (1.803 m)   Wt 229 lb 6.4 oz (104.1 kg)   BMI 31.99 kg/m   Wt Readings from Last 3 Encounters:  03/18/17 229 lb 6.4 oz (104.1 kg)  01/21/17 222 lb (100.7 kg)  01/14/17 223 lb 3.2 oz (101.2 kg)    Physical Exam General - overweight, well-appearing, NAD HEENT - Normocephalic, atraumatic Heart - RRR, no murmurs heard Lungs - Clear throughout all lobes, no wheezing, crackles, or rhonchi. Normal work of breathing. Abdomen - soft, NTND, no masses, no hepatosplenomegaly, active bowel sounds Extremeties - non-tender,  no edema, cap refill < 2 seconds, peripheral pulses intact +2 bilaterally Skin - warm, dry Neuro - awake, alert, oriented x3, normal gait, no tremor noted Psych - Normal mood and affect, normal behavior    Results for orders placed or performed in visit on 10/09/16  CBC  Result Value Ref Range   WBC 6.6 4.0 - 10.5 K/uL   RBC 4.83 4.22 - 5.81 Mil/uL   Platelets 305.0 150.0 - 400.0 K/uL   Hemoglobin 15.3 13.0  - 17.0 g/dL   HCT 44.1 39.0 - 52.0 %   MCV 91.4 78.0 - 100.0 fl   MCHC 34.6 30.0 - 36.0 g/dL   RDW 13.1 11.5 - 15.5 %  Comprehensive metabolic panel  Result Value Ref Range   Sodium 140 135 - 145 mEq/L   Potassium 4.4 3.5 - 5.1 mEq/L   Chloride 104 96 - 112 mEq/L   CO2 28 19 - 32 mEq/L   Glucose, Bld 90 70 - 99 mg/dL   BUN 11 6 - 23 mg/dL   Creatinine, Ser 0.80 0.40 - 1.50 mg/dL   Total Bilirubin 0.6 0.2 - 1.2 mg/dL   Alkaline Phosphatase 57 39 - 117 U/L   AST 12 0 - 37 U/L   ALT 17 0 - 53 U/L   Total Protein 7.2 6.0 - 8.3 g/dL   Albumin 4.5 3.5 - 5.2 g/dL   Calcium 9.6 8.4 - 10.5 mg/dL   GFR 120.94 >60.00 mL/min  Lipid panel  Result Value Ref Range   Cholesterol 178 0 - 200 mg/dL   Triglycerides 150.0 (H) 0.0 - 149.0 mg/dL   HDL 31.20 (L) >39.00 mg/dL   VLDL 30.0 0.0 - 40.0 mg/dL   LDL Cholesterol 117 (H) 0 - 99 mg/dL   Total CHOL/HDL Ratio 6    NonHDL 147.29       Assessment & Plan:   Problem List Items Addressed This Visit      Digestive   Irritable bowel syndrome (IBS) - Primary    Pt notes moderate improvement w/ now tolerable symptoms usually only in am.  Pt also avoiding lactose more intentionally.  Anxiety management also a contributing factor to improvement.  Pt using immodium for symptom management once weekly.  Plan: 1. Continue immodium prn. 2. Continue food diary to identify any other food triggers. 3. Continue working on stress management strategies. 4. Followup 6 months and as needed.      Relevant Medications   Loperamide HCl (IMODIUM PO)     Other   Depression    Pt now w/ only mild depression and anxiety on medications trazodone, escitalopram, and buspirone. Pt w/ improved self-management strategies w/ mindfulness. No SI/HI or plans to carry out.  PHQ9 and GAD7 are improved and indicate partial remission of depression and anxiety.  Plan: 1. Continue buspirone 10 mg bid. 2. Continue trazodone 50 mg at bedtime. 3. Continue  escitalopram 10 mg  once daily. 4. Encouraged proper self management strategies.  Perform regular exercise, continue mindfulness strategies. 5. Followup 6 months.      Relevant Medications   escitalopram (LEXAPRO) 10 MG tablet   traZODone (DESYREL) 50 MG tablet   busPIRone (BUSPAR) 10 MG tablet   Anxiety    See AP depression      Relevant Medications   escitalopram (LEXAPRO) 10 MG tablet   traZODone (DESYREL) 50 MG tablet   busPIRone (BUSPAR) 10 MG tablet      Meds ordered this encounter  Medications  . DISCONTD: busPIRone (BUSPAR) 10 MG tablet    Sig: Take 1 tablet (10 mg total) by mouth 2 (two) times daily.    Dispense:  90 tablet    Refill:  1    Order Specific Question:   Supervising Provider    Answer:   Olin Hauser [[id number]]  . escitalopram (LEXAPRO) 10 MG tablet    Sig: Take 1 tablet (10 mg total) by mouth daily.    Dispense:  90 tablet    Refill:  1    Order Specific Question:   Supervising Provider    Answer:   Olin Hauser [[id number]]  . traZODone (DESYREL) 50 MG tablet    Sig: Take 1 tablet (50 mg total) by mouth at bedtime.    Dispense:  90 tablet    Refill:  1    Order Specific Question:   Supervising Provider    Answer:   Olin Hauser [[id number]]  . busPIRone (BUSPAR) 10 MG tablet    Sig: Take 1 tablet (10 mg total) by mouth 2 (two) times daily.    Dispense:  180 tablet    Refill:  1    Order Specific Question:   Supervising Provider    Answer:   Olin Hauser [[id number]]     Follow up plan: Return in about 6 months (around 09/16/2017) for anxiety and depression.  [name], DNP, AGPCNP-BC Adult Gerontology Primary Care Nurse Practitioner [facility] 03/18/2017, 11:51 AM

## 2017-03-18 NOTE — Assessment & Plan Note (Addendum)
Pt now w/ only mild depression and anxiety on medications trazodone, escitalopram, and buspirone. Pt w/ improved self-management strategies w/ mindfulness. No SI/HI or plans to carry out.  PHQ9 and GAD7 are improved and indicate partial remission of depression and anxiety.  Plan: 1. Continue buspirone 10 mg bid. 2. Continue trazodone 50 mg at bedtime. 3. Continue escitalopram 10 mg once daily. 4. Encouraged proper self management strategies.  Perform regular exercise, continue mindfulness strategies. 5. Followup 6 months.

## 2017-03-18 NOTE — Patient Instructions (Addendum)
Jacob Andrews, Thank you for coming in to clinic today.  1. Continue your work on identifying food triggers. - Continue avoiding lactose.    2. For your anxiety: - Continue your work on mindfulness - Stopping to calm down. - No medication changes today.  Continue your trazodone, escitalopram, and buspirone.   Please schedule a follow-up appointment with [name], AGNP. Return in about 6 months (around 09/16/2017) for anxiety and depression.   If you have any other questions or concerns, please feel free to call the clinic or send a message through [place]. You may also schedule an earlier appointment if necessary.  You will receive a survey after today's visit either digitally by e-mail or paper by [employer]. Your experiences and feedback matter to [place].  Please respond so we know how we are doing as we provide care for you.   [name], DNP, AGNP-BC Adult Gerontology Nurse Practitioner [place]

## 2017-04-12 ENCOUNTER — Other Ambulatory Visit: Payer: Self-pay | Admitting: Nurse Practitioner

## 2017-04-12 DIAGNOSIS — R42 Dizziness and giddiness: Secondary | ICD-10-CM

## 2017-04-18 ENCOUNTER — Other Ambulatory Visit: Payer: Self-pay | Admitting: Nurse Practitioner

## 2017-08-23 ENCOUNTER — Other Ambulatory Visit: Payer: Self-pay | Admitting: Nurse Practitioner

## 2017-09-16 ENCOUNTER — Ambulatory Visit: Payer: 59 | Admitting: Nurse Practitioner

## 2017-09-25 DIAGNOSIS — K529 Noninfective gastroenteritis and colitis, unspecified: Secondary | ICD-10-CM | POA: Diagnosis not present

## 2017-11-16 ENCOUNTER — Other Ambulatory Visit: Payer: Self-pay | Admitting: Nurse Practitioner

## 2017-12-02 ENCOUNTER — Ambulatory Visit: Payer: 59 | Admitting: Nurse Practitioner

## 2018-01-13 DIAGNOSIS — H00019 Hordeolum externum unspecified eye, unspecified eyelid: Secondary | ICD-10-CM | POA: Diagnosis not present

## 2018-01-22 DIAGNOSIS — H00024 Hordeolum internum left upper eyelid: Secondary | ICD-10-CM | POA: Diagnosis not present

## 2018-01-27 ENCOUNTER — Other Ambulatory Visit: Payer: Self-pay

## 2018-01-27 ENCOUNTER — Ambulatory Visit (INDEPENDENT_AMBULATORY_CARE_PROVIDER_SITE_OTHER): Payer: BLUE CROSS/BLUE SHIELD | Admitting: Nurse Practitioner

## 2018-01-27 ENCOUNTER — Encounter: Payer: Self-pay | Admitting: Nurse Practitioner

## 2018-01-27 VITALS — BP 113/92 | HR 73 | Temp 98.4°F | Ht 71.0 in | Wt 234.4 lb

## 2018-01-27 DIAGNOSIS — M5441 Lumbago with sciatica, right side: Secondary | ICD-10-CM

## 2018-01-27 DIAGNOSIS — M5442 Lumbago with sciatica, left side: Secondary | ICD-10-CM | POA: Diagnosis not present

## 2018-01-27 DIAGNOSIS — F321 Major depressive disorder, single episode, moderate: Secondary | ICD-10-CM

## 2018-01-27 DIAGNOSIS — F419 Anxiety disorder, unspecified: Secondary | ICD-10-CM | POA: Diagnosis not present

## 2018-01-27 DIAGNOSIS — Z23 Encounter for immunization: Secondary | ICD-10-CM

## 2018-01-27 MED ORDER — BUSPIRONE HCL 10 MG PO TABS: 10.0000 mg | ORAL_TABLET | Freq: Two times a day (BID) | ORAL | 1 refills | Status: DC

## 2018-01-27 MED ORDER — PREDNISONE 10 MG PO TABS: ORAL_TABLET | ORAL | 0 refills | Status: DC

## 2018-01-27 MED ORDER — ESCITALOPRAM OXALATE 10 MG PO TABS: 10.0000 mg | ORAL_TABLET | Freq: Every day | ORAL | 1 refills | Status: DC

## 2018-01-27 NOTE — Progress Notes (Signed)
Subjective:    Patient ID: Jacob Andrews, male    DOB: 24-Mar-1986, 32 y.o.   MRN: [id number]  Jacob Andrews is a 32 y.o. male presenting on 01/27/2018 for Back Pain (lower back pain that radiates down to the glute and legs x 1 day ) and Depression   HPI Lower Back Pain No known injury.  Washed dog (15-20 lbs) sitting down and holding to restrain for haircut.  Several hours later had onset of mild back pain.  Has taken ibuprofen with very little relief over last 12 hours.  No problems with sleep overnight.  Depression Significantly improved.  Lexapro 10 mg once daily, buspirone 10 mg bid.  No ongoing concerns about depression with new job (significantly improved stress levels).  Patient stopped trazodone between visits.  No problems with sleep since stopping (2/2 oversleeping).  GAD 7 : Generalized Anxiety Score 01/27/2018 03/18/2017 03/18/2017 01/14/2017  Nervous, Anxious, on Edge 1 1 1 2   Control/stop worrying 0 1 1 2   Worry too much - different things 0 1 1 3   Trouble relaxing 0 0 0 1  Restless 0 0 0 1  Easily annoyed or irritable 0 1 1 1   Afraid - awful might happen 0 0 0 0  Total GAD 7 Score 1 4 4 10   Anxiety Difficulty Not difficult at all Not difficult at all Not difficult at all Somewhat difficult     Depression screen [facility] 2/9 03/18/2017 01/14/2017 11/27/2016 11/12/2016 12/20/2015  Decreased Interest 1 2 1 2 1   Down, Depressed, Hopeless 1 1 1 3 1   PHQ - 2 Score 2 3 2 5 2   Altered sleeping 0 2 2 2 2   Tired, decreased energy 1 2 2 2 1   Change in appetite 0 2 2 2 2   Feeling bad or failure about yourself  1 1 1 2 1   Trouble concentrating 0 1 1 1 1   Moving slowly or fidgety/restless 0 0 0 1 0  Suicidal thoughts - 0 0 1 0  PHQ-9 Score 4 11 10 16 9   Difficult doing work/chores Not difficult at all - - - -  Some recent data might be hidden     IBS No worsening symptoms, immediate urges are much less frequent.   Also has access to bathroom at work now which reduces  stress.  Social History   Tobacco Use  . Smoking status: Former Smoker    Last attempt to quit: 2012    Years since quitting: 7.7  . Smokeless tobacco: Never Used  Substance Use Topics  . Alcohol use: No    Alcohol/week: 0.0 standard drinks  . Drug use: Yes    Types: Marijuana    Review of Systems Per HPI unless specifically indicated above     Objective:    BP (!) 113/92 (BP Location: Right Arm, Patient Position: Sitting, Cuff Size: Normal)   Pulse 73   Temp 98.4 F (36.9 C) (Oral)   Ht 5\' 11"  (1.803 m)   Wt 234 lb 6.4 oz (106.3 kg)   BMI 32.69 kg/m   Wt Readings from Last 3 Encounters:  01/27/18 234 lb 6.4 oz (106.3 kg)  03/18/17 229 lb 6.4 oz (104.1 kg)  01/21/17 222 lb (100.7 kg)    Physical Exam  Constitutional: He is oriented to person, place, and time. He appears well-developed and well-nourished. No distress.  HENT:  Head: Normocephalic and atraumatic.  Cardiovascular: Normal rate, regular rhythm, S1 normal, S2 normal, normal heart sounds  and intact distal pulses.  Pulmonary/Chest: Effort normal and breath sounds normal. No respiratory distress.  Musculoskeletal:  Low Back Inspection: Normal appearance, Large body habitus, no spinal deformity, symmetrical. Palpation: No tenderness over spinous processes. Bilateral lumbar paraspinal muscles tender and without hypertonicity/spasm. ROM: Severely limited AROM forward flexion.  Normal AROM back extension, rotation L/R without discomfort Special Testing: Seated SLR positive for radicular pain bilaterally and with reproduced bilateral R/L back pain. Strength: Bilateral hip flex/ext 5/5, knee flex/ext 5/5, ankle dorsiflex/plantarflex 5/5 Neurovascular: intact distal sensation to light touch  Neurological: He is alert and oriented to person, place, and time.  Skin: Skin is warm and dry. Capillary refill takes less than 2 seconds.  Psychiatric: He has a normal mood and affect. His behavior is normal. Judgment and  thought content normal.  Vitals reviewed.    Results for orders placed or performed in visit on 10/09/16  CBC  Result Value Ref Range   WBC 6.6 4.0 - 10.5 K/uL   RBC 4.83 4.22 - 5.81 Mil/uL   Platelets 305.0 150.0 - 400.0 K/uL   Hemoglobin 15.3 13.0 - 17.0 g/dL   HCT 44.1 39.0 - 52.0 %   MCV 91.4 78.0 - 100.0 fl   MCHC 34.6 30.0 - 36.0 g/dL   RDW 13.1 11.5 - 15.5 %  Comprehensive metabolic panel  Result Value Ref Range   Sodium 140 135 - 145 mEq/L   Potassium 4.4 3.5 - 5.1 mEq/L   Chloride 104 96 - 112 mEq/L   CO2 28 19 - 32 mEq/L   Glucose, Bld 90 70 - 99 mg/dL   BUN 11 6 - 23 mg/dL   Creatinine, Ser 0.80 0.40 - 1.50 mg/dL   Total Bilirubin 0.6 0.2 - 1.2 mg/dL   Alkaline Phosphatase 57 39 - 117 U/L   AST 12 0 - 37 U/L   ALT 17 0 - 53 U/L   Total Protein 7.2 6.0 - 8.3 g/dL   Albumin 4.5 3.5 - 5.2 g/dL   Calcium 9.6 8.4 - 10.5 mg/dL   GFR 120.94 >60.00 mL/min  Lipid panel  Result Value Ref Range   Cholesterol 178 0 - 200 mg/dL   Triglycerides 150.0 (H) 0.0 - 149.0 mg/dL   HDL 31.20 (L) >39.00 mg/dL   VLDL 30.0 0.0 - 40.0 mg/dL   LDL Cholesterol 117 (H) 0 - 99 mg/dL   Total CHOL/HDL Ratio 6    NonHDL 147.29       Assessment & Plan:   Problem List Items Addressed This Visit      Other   Depression   Relevant Medications   busPIRone (BUSPAR) 10 MG tablet   escitalopram (LEXAPRO) 10 MG tablet   Anxiety   Relevant Medications   busPIRone (BUSPAR) 10 MG tablet   escitalopram (LEXAPRO) 10 MG tablet    Other Visit Diagnoses    Needs flu shot    -  Primary   Relevant Orders   Flu Vaccine QUAD 6+ mos PF IM (Fluarix Quad PF)   Acute bilateral low back pain with bilateral sciatica       Relevant Medications   predniSONE (DELTASONE) 10 MG tablet    # Stable and improved anxiety and depression with change in job.  PHQ and GAD7 improved.  Continue buspirone and escitalopram without dose changes.  Continue non-pharm methods for stress management.  Continue off  Trazodone as patient has already stopped this and doing well without. Consider reducing dose of SSRI or buspirone in spring.  Followup 3 months and sooner if needed.   # Pain likely self-limited.  Muscle strain possible complicated by poor posture while standing and straining with bathing/grooming his dog. Plan:  1. Treat with OTC pain meds (acetaminophen and ibuprofen). Ibuprofen only after prednisone. Discussed alternate dosing and max dosing. - Start prednisone taper over 7 days Day 1-2: 60 mg, Day 3: 50 mg, Day 4: 40 mg; Day 5: 30 mg; Day 6 20 mg; Day 7: 10 mg then stop. 2. Apply heat and/or ice to affected area. 3. May also apply a muscle rub with lidocaine or lidocaine patch after heat or ice. 4. Take muscle relaxer baclofen 10 mg up to three times daily.  Cautioned drowsiness. May take only at bedtime 5. Follow up 2-4 weeks if no improvement prn and 3 months.    Meds ordered this encounter  Medications  . busPIRone (BUSPAR) 10 MG tablet    Sig: Take 1 tablet (10 mg total) by mouth 2 (two) times daily.    Dispense:  180 tablet    Refill:  1    Order Specific Question:   Supervising Provider    Answer:   Olin Hauser [[id number]]  . escitalopram (LEXAPRO) 10 MG tablet    Sig: Take 1 tablet (10 mg total) by mouth daily.    Dispense:  90 tablet    Refill:  1    Order Specific Question:   Supervising Provider    Answer:   Olin Hauser [[id number]]  . predniSONE (DELTASONE) 10 MG tablet    Sig: Day 1-2 take 6 pills. Day 3 take 5 pills then reduce by 1 pill each day.    Dispense:  27 tablet    Refill:  0    Order Specific Question:   Supervising Provider    Answer:   Olin Hauser [[id number]]    Follow up plan: Return in about 3 months (around 04/29/2018) for depression, anxiety.  [name], DNP, AGPCNP-BC Adult Gerontology Primary Care Nurse Practitioner [facility] 01/27/2018, 9:50 AM

## 2018-01-27 NOTE — Patient Instructions (Addendum)
Jacob Andrews,   Thank you for coming in to clinic today.  1. Continue Lexapro 10 mg once daily - Continue buspirone 10 mg twice daily  We will consider reducing buspirone in Spring.  2. You have a lumbar muscle strain with pinched nerve.  Possible disc bulging. - Start taking Tylenol extra strength 1 to 2 tablets every 6-8 hours for aches or fever/chills for next few days as needed.  Do not take more than 3,000 mg in 24 hours from all medicines.   - Take prednisone taper 10 mg tablets Day 1 (Today): Take 6 pills at one time Day 2: Take 6 pills  Day 3: Take 5 pills Day 4: Take 4 pills Day 5: Take 3 pills Day 6: Take 2 pills Day 7: Take 1 pill then stop.  - AFTER prednisone May take Ibuprofen as well if tolerated 200-400mg  every 8 hours as needed. May alternate tylenol and ibuprofen in same day. - Use heat and ice.  Apply this for 15 minutes at a time 6-8 times per day.   - Muscle rub with lidocaine, lidocaine patch, Biofreeze, or tiger balm for topical pain relief.  Avoid using this with heat and ice to avoid burns. - START muscle relaxer baclofen 10 mg one tablet up to three times daily.  This can cause drowsiness, so use caution.  It may be best to only take this at night for helping you during sleep.    Low Back Pain Exercises See other page with pictures of each exercise.  Start with 1 or 2 of these exercises that you are most comfortable with. Do not do any exercises that cause you significant worsening pain. Some of these may cause some "stretching soreness" but it should go away after you stop the exercise, and get better over time. Gradually increase up to 3-4 exercises as tolerated.  Standing hamstring stretch: Place the heel of your leg on a stool about 15 inches high. Keep your knee straight. Lean forward, bending at the hips until you feel a mild stretch in the back of your thigh. Make sure you do not roll your shoulders and bend at the waist when doing this or you will  stretch your lower back instead. Hold the stretch for 15 to 30 seconds. Repeat 3 times. Repeat the same stretch on your other leg.  Cat and camel: Get down on your hands and knees. Let your stomach sag, allowing your back to curve downward. Hold this position for 5 seconds. Then arch your back and hold for 5 seconds. Do 3 sets of 10.  Quadriped Arm/Leg Raises: Get down on your hands and knees. Tighten your abdominal muscles to stiffen your spine. While keeping your abdominals tight, raise one arm and the opposite leg away from you. Hold this position for 5 seconds. Lower your arm and leg slowly and alternate sides. Do this 10 times on each side.  Pelvic tilt: Lie on your back with your knees bent and your feet flat on the floor. Tighten your abdominal muscles and push your lower back into the floor. Hold this position for 5 seconds, then relax. Do 3 sets of 10.  Partial curl: Lie on your back with your knees bent and your feet flat on the floor. Tighten your stomach muscles and flatten your back against the floor. Tuck your chin to your chest. With your hands stretched out in front of you, curl your upper body forward until your shoulders clear the floor. Hold this  position for 3 seconds. Don't hold your breath. It helps to breathe out as you lift your shoulders up. Relax. Repeat 10 times. Build to 3 sets of 10. To challenge yourself, clasp your hands behind your head and keep your elbows out to the side.  Lower trunk rotation: Lie on your back with your knees bent and your feet flat on the floor. Tighten your abdominal muscles and push your lower back into the floor. Keeping your shoulders down flat, gently rotate your legs to one side, then the other as far as you can. Repeat 10 to 20 times.  Single knee to chest stretch: Lie on your back with your legs straight out in front of you. Bring one knee up to your chest and grasp the back of your thigh. Pull your knee toward your chest, stretching your  buttock muscle. Hold this position for 15 to 30 seconds and return to the starting position. Repeat 3 times on each side.  Double knee to chest: Lie on your back with your knees bent and your feet flat on the floor. Tighten your abdominal muscles and push your lower back into the floor. Pull both knees up to your chest. Hold for 5 seconds and repeat 10 to 20 times.     Please schedule a follow-up appointment with [name], AGNP.  Return in about 3 months (around 04/29/2018) for depression, anxiety.  If you have any other questions or concerns, please feel free to call the clinic or send a message through [place]. You may also schedule an earlier appointment if necessary.  You will receive a survey after today's visit either digitally by e-mail or paper by [employer]. Your experiences and feedback matter to [place].  Please respond so we know how we are doing as we provide care for you.   [name], DNP, AGNP-BC Adult Gerontology Nurse Practitioner [place]

## 2018-01-29 ENCOUNTER — Encounter: Payer: Self-pay | Admitting: Nurse Practitioner

## 2018-05-01 ENCOUNTER — Ambulatory Visit: Payer: BLUE CROSS/BLUE SHIELD | Admitting: Nurse Practitioner

## 2018-05-06 ENCOUNTER — Encounter: Payer: Self-pay | Admitting: Family Medicine

## 2018-05-06 ENCOUNTER — Ambulatory Visit (INDEPENDENT_AMBULATORY_CARE_PROVIDER_SITE_OTHER): Payer: BLUE CROSS/BLUE SHIELD | Admitting: Family Medicine

## 2018-05-06 VITALS — BP 118/65 | HR 73 | Temp 98.8°F | Resp 16 | Ht 71.0 in | Wt 230.0 lb

## 2018-05-06 DIAGNOSIS — K580 Irritable bowel syndrome with diarrhea: Secondary | ICD-10-CM

## 2018-05-06 DIAGNOSIS — R112 Nausea with vomiting, unspecified: Secondary | ICD-10-CM | POA: Diagnosis not present

## 2018-05-06 MED ORDER — PANTOPRAZOLE SODIUM 40 MG PO TBEC: 40.0000 mg | DELAYED_RELEASE_TABLET | Freq: Every day | ORAL | 0 refills | Status: DC

## 2018-05-06 MED ORDER — ONDANSETRON HCL 4 MG PO TABS: 4.0000 mg | ORAL_TABLET | Freq: Every day | ORAL | 0 refills | Status: DC

## 2018-05-06 NOTE — Patient Instructions (Addendum)
Thank you for coming to the office today.  Keep on current treatment for IBS with improved diet, increased fiber trying to regulate bowel movements  Avoid regular use of caffeine  Try OTC Peppermint Oil (Triple Coated Capsule) 180mg  take one 3 times daily to reduce diarrhea  Referral to new GI doctor in [place] - colleague of Dr Allen Norris, we are looking for 2nd opinion  [facility]) [address], [place] [address] Phone: [phone]  Please schedule a Follow-up Appointment to: Return in about 6 weeks (around 06/17/2018) for IBS Diarrhea, f/u after GI.  If you have any other questions or concerns, please feel free to call the office or send a message through [place]. You may also schedule an earlier appointment if necessary.  Additionally, you may be receiving a survey about your experience at our office within a few days to 1 week by e-mail or mail. We value your feedback.  Nobie Putnam, DO [place]

## 2018-05-06 NOTE — Progress Notes (Signed)
Subjective:    Patient ID: Jacob Andrews, male    DOB: [birth year], 33 y.o.   MRN: [id number]  Jacob Andrews is a 33 y.o. male presenting on 05/06/2018 for Abdominal Pain (nausea and diarrhea onset week, chills denies fever but has bodyache)  Patient presents for a same day appointment.   HPI   CHRONIC ABDOMINAL PAIN / DIARRHEA / Presumed IBS Admits chronic problem with stomach for past few years, he describes often will wake up around 430 am to have bowel movement, will have episodes of diarrhea and nausea vomiting, usually in early morning only, this is worsening in past 1 week, he is not having nausea vomiting during the daytime. He describes some of the vomiting that appears "mucus like" or "drainage" he has had that before. - He has adjusted diet throughout the years, and he has identified he is lactose intolerant, usually improved off of lactose, but no other diet has changed his symptoms - Admits occasional abdominal cramping associated with bowel movements at times but not regular problem - admits some bowel urgency at times  Prior GI Dr Allen Norris recent few years, thought to be IBS after overall results. Initially they thought Crohn's clinically, biopsy was not consistent. He had one polyp removed with adenoma  He does not admit stress, but thought in past he had some job change and stress, thinks this is less related now on medications He increased fiber supplement Taking Zofran ODT - previously was reducing some of his frequent nausea and stomach ache He has tried Dicyclomine regularly PRN in past. He has tried Imodium PRN not regularly but it does help some  - Denies any blood in stool or vomit, no hematemesis or hematochezia, incontinence of stool   Depression screen [facility] 2/9 05/06/2018 05/06/2018 03/18/2017  Decreased Interest 1 1 1   Down, Depressed, Hopeless 0 0 1  PHQ - 2 Score 1 1 2   Altered sleeping - - 0  Tired, decreased energy - - 1  Change in appetite - - 0  Feeling  bad or failure about yourself  - - 1  Trouble concentrating - - 0  Moving slowly or fidgety/restless - - 0  Suicidal thoughts - - -  PHQ-9 Score - - 4  Difficult doing work/chores - - Not difficult at all  Some recent data might be hidden    Social History   Tobacco Use  . Smoking status: Never Smoker  . Smokeless tobacco: Never Used  Substance Use Topics  . Alcohol use: Not on file  . Drug use: Not Currently    Types: Marijuana    Review of Systems Per HPI unless specifically indicated above     Objective:    BP 118/65   Pulse 73   Temp 98.8 F (37.1 C) (Oral)   Resp 16   Ht 5\' 11"  (1.803 m)   Wt 230 lb (104.3 kg)   SpO2 97%   BMI 32.08 kg/m   Wt Readings from Last 3 Encounters:  05/06/18 230 lb (104.3 kg)  01/27/18 234 lb 6.4 oz (106.3 kg)  03/18/17 229 lb 6.4 oz (104.1 kg)    Physical Exam Vitals signs and nursing note reviewed.  Constitutional:      General: He is not in acute distress.    Appearance: He is well-developed. He is not diaphoretic.     Comments: Well-appearing, comfortable, cooperative  HENT:     Head: Normocephalic and atraumatic.  Eyes:     General:  Right eye: No discharge.        Left eye: No discharge.     Conjunctiva/sclera: Conjunctivae normal.  Cardiovascular:     Rate and Rhythm: Normal rate.  Pulmonary:     Effort: Pulmonary effort is normal.  Abdominal:     General: Bowel sounds are normal.     Tenderness: There is no abdominal tenderness. There is no guarding or rebound. Negative signs include Murphy's sign, Rovsing's sign and McBurney's sign.  Skin:    General: Skin is warm and dry.     Findings: No erythema or rash.  Neurological:     Mental Status: He is alert and oriented to person, place, and time.  Psychiatric:        Behavior: Behavior normal.     Comments: Well groomed, good eye contact, normal speech and thoughts    Results for orders placed or performed in visit on 10/09/16  CBC  Result Value Ref  Range   WBC 6.6 4.0 - 10.5 K/uL   RBC 4.83 4.22 - 5.81 Mil/uL   Platelets 305.0 150.0 - 400.0 K/uL   Hemoglobin 15.3 13.0 - 17.0 g/dL   HCT 44.1 39.0 - 52.0 %   MCV 91.4 78.0 - 100.0 fl   MCHC 34.6 30.0 - 36.0 g/dL   RDW 13.1 11.5 - 15.5 %  Comprehensive metabolic panel  Result Value Ref Range   Sodium 140 135 - 145 mEq/L   Potassium 4.4 3.5 - 5.1 mEq/L   Chloride 104 96 - 112 mEq/L   CO2 28 19 - 32 mEq/L   Glucose, Bld 90 70 - 99 mg/dL   BUN 11 6 - 23 mg/dL   Creatinine, Ser 0.80 0.40 - 1.50 mg/dL   Total Bilirubin 0.6 0.2 - 1.2 mg/dL   Alkaline Phosphatase 57 39 - 117 U/L   AST 12 0 - 37 U/L   ALT 17 0 - 53 U/L   Total Protein 7.2 6.0 - 8.3 g/dL   Albumin 4.5 3.5 - 5.2 g/dL   Calcium 9.6 8.4 - 10.5 mg/dL   GFR 120.94 >60.00 mL/min  Lipid panel  Result Value Ref Range   Cholesterol 178 0 - 200 mg/dL   Triglycerides 150.0 (H) 0.0 - 149.0 mg/dL   HDL 31.20 (L) >39.00 mg/dL   VLDL 30.0 0.0 - 40.0 mg/dL   LDL Cholesterol 117 (H) 0 - 99 mg/dL   Total CHOL/HDL Ratio 6    NonHDL 147.29       Assessment & Plan:   Problem List Items Addressed This Visit    Irritable bowel syndrome (IBS) - Primary   Relevant Medications   ondansetron (ZOFRAN) 4 MG tablet   pantoprazole (PROTONIX) 40 MG tablet   Other Relevant Orders   Ambulatory referral to Gastroenterology    Other Visit Diagnoses    Non-intractable vomiting with nausea, unspecified vomiting type       Relevant Medications   ondansetron (ZOFRAN) 4 MG tablet   pantoprazole (PROTONIX) 40 MG tablet      Clinically with chronic functional GI symptoms with diarrhea, episodic nausea, and abdominal discomfort. - Suspected IBS-D as likely source, history of anxiety as well but seems less related now - Prior concern with clinical diagnosis of Crohn's in past by GI and suspicion on colonoscopy but biopsy was not supportive, 12/2016 - Tried most functional GI meds / diet changes / anxiety meds - mixed but limited  benefit  Plan - Discussed emphasis still on diet improvement, inc  fiber, hydration, avoid trigger foods - He should use imodium PRN to slow diarrhea still - TRIAL of OTC Peppermint Oil (Triple Coated Capsule) 180mg  take one 3 times daily to reduce diarrhea - Refill Pantoprazole, Zofran - Will hold on adjusting anxiety med a this time given his limited benefit by his report  Referral back to [facility] - may go to [place] now, consider 2nd opinion, or can return to Dr Allen Norris as well for re-evaluation.   Meds ordered this encounter  Medications  . ondansetron (ZOFRAN) 4 MG tablet    Sig: Take 1 tablet (4 mg total) by mouth daily.    Dispense:  90 tablet    Refill:  0  . pantoprazole (PROTONIX) 40 MG tablet    Sig: Take 1 tablet (40 mg total) by mouth daily before breakfast.    Dispense:  90 tablet    Refill:  0   Orders Placed This Encounter  Procedures  . Ambulatory referral to Gastroenterology    Referral Priority:   Routine    Referral Type:   Consultation    Referral Reason:   Specialty Services Required    Number of Visits Requested:   1     Follow up plan: Return in about 6 weeks (around 06/17/2018) for IBS Diarrhea, f/u after GI.   Nobie Putnam, [facility] 05/06/2018, 10:08 AM

## 2018-05-07 ENCOUNTER — Encounter: Payer: Self-pay | Admitting: Family Medicine

## 2018-05-12 ENCOUNTER — Encounter: Payer: Self-pay | Admitting: *Deleted

## 2018-05-14 ENCOUNTER — Ambulatory Visit
Admission: EM | Admit: 2018-05-14 | Discharge: 2018-05-14 | Disposition: A | Discharge status: Home / Self Care | Payer: BLUE CROSS/BLUE SHIELD | Attending: Family Medicine | Admitting: Family Medicine

## 2018-05-14 ENCOUNTER — Encounter: Payer: Self-pay | Admitting: Emergency Medicine

## 2018-05-14 ENCOUNTER — Other Ambulatory Visit: Payer: Self-pay

## 2018-05-14 DIAGNOSIS — Z87898 Personal history of other specified conditions: Secondary | ICD-10-CM

## 2018-05-14 DIAGNOSIS — R42 Dizziness and giddiness: Secondary | ICD-10-CM | POA: Diagnosis not present

## 2018-05-14 LAB — COMPREHENSIVE METABOLIC PANEL
ALBUMIN: 5 g/dL (ref 3.5–5.0)
ALT: 26 U/L (ref 0–44)
AST: 20 U/L (ref 15–41)
Alkaline Phosphatase: 65 U/L (ref 38–126)
Anion gap: 9 (ref 5–15)
BUN: 15 mg/dL (ref 6–20)
CALCIUM: 9.5 mg/dL (ref 8.9–10.3)
CO2: 25 mmol/L (ref 22–32)
Chloride: 102 mmol/L (ref 98–111)
Creatinine, Ser: 0.81 mg/dL (ref 0.61–1.24)
GFR calc Af Amer: >60 mL/min (ref >60)
GFR calc non Af Amer: >60 mL/min (ref >60)
GLUCOSE: 89 mg/dL (ref 70–99)
POTASSIUM: 4.1 mmol/L (ref 3.5–5.1)
SODIUM: 136 mmol/L (ref 135–145)
TOTAL PROTEIN: 8.5 g/dL — AB (ref 6.5–8.1)
Total Bilirubin: 1.1 mg/dL (ref 0.3–1.2)

## 2018-05-14 LAB — CBC WITH DIFFERENTIAL/PLATELET
ABS IMMATURE GRANULOCYTES: 0.05 10*3/uL (ref 0.00–0.07)
BASOS ABS: 0.1 10*3/uL (ref 0.0–0.1)
BASOS PCT: 1 %
Eosinophils Absolute: 0.2 10*3/uL (ref 0.0–0.5)
Eosinophils Relative: 2 %
HCT: 45.9 % (ref 39.0–52.0)
Hemoglobin: 16.2 g/dL (ref 13.0–17.0)
Immature Granulocytes: 1 %
LYMPHS PCT: 31 %
Lymphs Abs: 3.1 10*3/uL (ref 0.7–4.0)
MCH: 32.1 pg (ref 26.0–34.0)
MCHC: 35.3 g/dL (ref 30.0–36.0)
MCV: 91.1 fL (ref 80.0–100.0)
MONO ABS: 0.7 10*3/uL (ref 0.1–1.0)
Monocytes Relative: 7 %
NEUTROS ABS: 5.9 10*3/uL (ref 1.7–7.7)
NEUTROS PCT: 58 %
NRBC: 0 % (ref 0.0–0.2)
PLATELETS: 263 10*3/uL (ref 150–400)
RBC: 5.04 MIL/uL (ref 4.22–5.81)
RDW: 12.1 % (ref 11.5–15.5)
WBC: 10 10*3/uL (ref 4.0–10.5)

## 2018-05-14 LAB — URINALYSIS, COMPLETE (UACMP) WITH MICROSCOPIC
Glucose, UA: NEGATIVE mg/dL
Ketones, ur: >160 mg/dL — AB
Leukocytes, UA: NEGATIVE
Nitrite: NEGATIVE
Protein, ur: NEGATIVE mg/dL
pH: 5 (ref 5.0–8.0)

## 2018-05-14 NOTE — ED Provider Notes (Signed)
[facility]    CSN: [id number] Arrival date & time: 05/14/18  1721     History   Chief Complaint Chief Complaint  Patient presents with  . Dizziness    HPI Jacob Andrews is a 33 y.o. male.   33 yo male with a c/o an episode of dizziness and sudden urinary incontinence today while at work. States episode occurred around 2:30pm and  lasted seconds. States  he felt like he was going to pass out but he denies loss of consciousness. Patient states that for about 1 hour afterwards he "felt out of it" and "very tired". Denies any vision changes, numbness/tingling, one-sided weakness, slurred speech or trouble swallowing, palpitations, chest pains, shortness of breath. Currently states that he feels fine. No symptoms currently.   Patient states he just found out from his mom, that his father suffered from seizures as a child.   The history is provided by the patient.    Past Medical History:  Diagnosis Date  . Bone spur    right knee  . Depression   . IBS (irritable bowel syndrome)   . Kidney stone    current stone outside kidney after surgery.   . OSA (obstructive sleep apnea)    no CPAP- could not get "adjusted" correctly  . Vertigo     Patient Active Problem List   Diagnosis Date Noted  . Noninfectious diarrhea   . Polyp of sigmoid colon   . Benign neoplasm of transverse colon   . Anxiety 11/12/2016  . RUQ pain 10/09/2016  . Right knee pain 07/09/2016  . Sleep apnea in adult 11/29/2014  . Irritable bowel syndrome (IBS) 11/08/2014  . Depression 11/08/2014  . Lactose intolerance in adult 11/08/2014    Past Surgical History:  Procedure Laterality Date  . APPENDECTOMY  2013  . COLONOSCOPY WITH PROPOFOL N/A 12/31/2016   Procedure: COLONOSCOPY WITH PROPOFOL;  Surgeon: Lucilla Lame, MD;  Location: [place];  Service: Gastroenterology;  Laterality: N/A;  sleep apnea  . KIDNEY SURGERY  2010  . POLYPECTOMY  12/31/2016   Procedure: POLYPECTOMY;   Surgeon: Lucilla Lame, MD;  Location: [place];  Service: Gastroenterology;;  . URETERAL STENT PLACEMENT  2012       Home Medications    Prior to Admission medications   Medication Sig Start Date End Date Taking? Authorizing Provider  busPIRone (BUSPAR) 10 MG tablet Take 1 tablet (10 mg total) by mouth 2 (two) times daily. 01/27/18  Yes Mikey College, NP  escitalopram (LEXAPRO) 10 MG tablet Take 1 tablet (10 mg total) by mouth daily. 01/27/18  Yes Mikey College, NP  ondansetron (ZOFRAN) 4 MG tablet Take 1 tablet (4 mg total) by mouth daily. 05/06/18  Yes Karamalegos, Devonne Doughty, DO  pantoprazole (PROTONIX) 40 MG tablet Take 1 tablet (40 mg total) by mouth daily before breakfast. 05/06/18  Yes Karamalegos, Alexander J, DO  fluticasone (FLONASE) 50 MCG/ACT nasal spray 2 sprays by Nasal route once daily. 07/09/16   [provider]  Loperamide HCl (IMODIUM PO) Take by mouth.    [provider]  predniSONE (DELTASONE) 10 MG tablet Day 1-2 take 6 pills. Day 3 take 5 pills then reduce by 1 pill each day. Patient not taking: Reported on 05/06/2018 01/27/18   Mikey College, NP    Family History Family History  Problem Relation Age of Onset  . Diabetes Father   . Hypertension Mother   . Heart disease Maternal Grandmother  heart attack  . Heart disease Paternal Grandfather   . Stroke Paternal Grandfather     Social History Social History   Tobacco Use  . Smoking status: Never Smoker  . Smokeless tobacco: Never Used  Substance Use Topics  . Alcohol use: Not on file  . Drug use: Not Currently    Types: Marijuana     Allergies   Cinnamon and Oxycodone   Review of Systems Review of Systems   Physical Exam Triage Vital Signs ED Triage Vitals  Enc Vitals Group     BP 05/14/18 1804 (!) 146/91     Pulse Rate 05/14/18 1804 77     Resp 05/14/18 1804 16     Temp 05/14/18 1804 98.4 F (36.9 C)     Temp Source 05/14/18 1804  Oral     SpO2 05/14/18 1804 99 %     Weight 05/14/18 1801 230 lb (104.3 kg)     Height 05/14/18 1801 5\' 11"  (1.803 m)     Head Circumference --      Peak Flow --      Pain Score 05/14/18 1801 0     Pain Loc --      Pain Edu? --      Excl. in [place]? --    No data found.  Updated Vital Signs BP (!) 146/91 (BP Location: Left Arm)   Pulse 77   Temp 98.4 F (36.9 C) (Oral)   Resp 16   Ht 5\' 11"  (1.803 m)   Wt 104.3 kg   SpO2 99%   BMI 32.08 kg/m   Visual Acuity Right Eye Distance:   Left Eye Distance:   Bilateral Distance:    Right Eye Near:   Left Eye Near:    Bilateral Near:     Physical Exam Vitals signs and nursing note reviewed.  Constitutional:      General: He is not in acute distress.    Appearance: Normal appearance. He is well-developed. He is not toxic-appearing or diaphoretic.  HENT:     Head: Normocephalic and atraumatic.     Right Ear: Tympanic membrane, ear canal and external ear normal.     Left Ear: Tympanic membrane, ear canal and external ear normal.     Nose: Nose normal.     Mouth/Throat:     Pharynx: Uvula midline. No oropharyngeal exudate.     Tonsils: No tonsillar abscesses.  Eyes:     General: No scleral icterus.       Right eye: No discharge.        Left eye: No discharge.     Extraocular Movements: Extraocular movements intact.     Conjunctiva/sclera: Conjunctivae normal.     Pupils: Pupils are equal, round, and reactive to light.  Neck:     Musculoskeletal: Normal range of motion and neck supple.     Thyroid: No thyromegaly.     Trachea: No tracheal deviation.  Cardiovascular:     Rate and Rhythm: Normal rate and regular rhythm.     Heart sounds: Normal heart sounds.  Pulmonary:     Effort: Pulmonary effort is normal. No respiratory distress.     Breath sounds: Normal breath sounds. No stridor. No wheezing or rales.  Chest:     Chest wall: No tenderness.  Lymphadenopathy:     Cervical: No cervical adenopathy.  Skin:    General:  Skin is warm and dry.     Findings: No rash.  Neurological:  General: No focal deficit present.     Mental Status: He is alert and oriented to person, place, and time.     Cranial Nerves: No cranial nerve deficit.     Sensory: No sensory deficit.     Motor: No weakness.     Coordination: Coordination normal.     Gait: Gait normal.     Deep Tendon Reflexes: Reflexes normal.      UC Treatments / Results  Labs (all labs ordered are listed, but only abnormal results are displayed) Labs Reviewed  URINALYSIS, COMPLETE (UACMP) WITH MICROSCOPIC - Abnormal; Notable for the following components:      Result Value   APPearance HAZY (*)    Specific Gravity, Urine >1.030 (*)    Hgb urine dipstick TRACE (*)    Bilirubin Urine SMALL (*)    Ketones, ur >160 (*)    Bacteria, UA FEW (*)    All other components within normal limits  COMPREHENSIVE METABOLIC PANEL - Abnormal; Notable for the following components:   Total Protein 8.5 (*)    All other components within normal limits  URINE CULTURE  CBC WITH DIFFERENTIAL/PLATELET    EKG None  Radiology No results found.  Procedures Procedures (including critical care time)  Medications Ordered in UC Medications - No data to display  Initial Impression / Assessment and Plan / UC Course  I have reviewed the triage vital signs and the nursing notes.  Pertinent labs & imaging results that were available during my care of the patient were reviewed by me and considered in my medical decision making (see chart for details).      Final Clinical Impressions(s) / UC Diagnoses   Final diagnoses:  Episode of dizziness  H/O urinary incontinence  (possible seizure episode)   Discharge Instructions     Recommend patient follow up with Primary Care Provider and Neurologist for further evaluation    ED Prescriptions    None     1. Lab result (normal/negative) and diagnosis reviewed with patient 2. Recommend follow up with PCP and  neurologist for further evaluation  3. Follow-up here prn  Controlled Substance Prescriptions [place] Controlled Substance Registry consulted? Not Applicable   Norval Gable, MD 05/14/18 2129

## 2018-05-14 NOTE — ED Triage Notes (Signed)
Patient states that around 2:30pm while at work he states that he had an episode of where he stood up and felt warm, dizzy and started peeing.  Patient states that at the time he did have some right sided flank pain but no longer is having that pain at this time. Patient denies N/V.

## 2018-05-14 NOTE — Discharge Instructions (Signed)
Recommend patient follow up with Primary Care Provider and Neurologist for further evaluation

## 2018-05-16 LAB — URINE CULTURE
Culture: <10000 — AB
SPECIAL REQUESTS: NORMAL

## 2018-05-19 ENCOUNTER — Other Ambulatory Visit: Payer: Self-pay

## 2018-05-19 ENCOUNTER — Encounter: Payer: Self-pay | Admitting: Nurse Practitioner

## 2018-05-19 ENCOUNTER — Ambulatory Visit (INDEPENDENT_AMBULATORY_CARE_PROVIDER_SITE_OTHER): Payer: BLUE CROSS/BLUE SHIELD | Admitting: Nurse Practitioner

## 2018-05-19 VITALS — BP 106/67 | Temp 98.2°F | Ht 71.0 in | Wt 226.6 lb

## 2018-05-19 DIAGNOSIS — N39498 Other specified urinary incontinence: Secondary | ICD-10-CM | POA: Diagnosis not present

## 2018-05-19 DIAGNOSIS — R55 Syncope and collapse: Secondary | ICD-10-CM

## 2018-05-19 DIAGNOSIS — R8290 Unspecified abnormal findings in urine: Secondary | ICD-10-CM

## 2018-05-19 NOTE — Patient Instructions (Addendum)
Jacob Andrews,   Thank you for coming in to clinic today.  1. Keep monitoring for any return of symptoms.  If occur go to ER.  Then call us for possible starting anti-seizure med. - If you have heart racing or flutters separate from other symptoms, call clinic.  [facility] - Neurology Dept [place], [place] [address] Phone: [phone]  Please schedule a follow-up appointment with Jacob Andrews, AGNP. Return in about 6 months (around 11/17/2018) for annual physical AND as needed if symptoms return.  If you have any other questions or concerns, please feel free to call the clinic or send a message through [place]. You may also schedule an earlier appointment if necessary.  You will receive a survey after today's visit either digitally by e-mail or paper by [employer]. Your experiences and feedback matter to [place].  Please respond so we know how we are doing as we provide care for you.   Jacob Smiles, DNP, AGNP-BC Adult Gerontology Nurse Practitioner [place]

## 2018-05-19 NOTE — Progress Notes (Signed)
Subjective:    Patient ID: Jacob Andrews, male    DOB: [birth year], 33 y.o.   MRN: [id number]  Jacob Andrews is a 33 y.o. male presenting on 05/19/2018 for Seizures (need a referral to neurologist, pt x 4 days ago possible seizure. P was seen in the urgent care.)   HPI Near Syncope Patient seen 4 days ago at [facility] for possible seizure.  He had onset of dizziness and sudden urinary incontinence when at work.  Episode lasting only seconds.  Postdromal period lasted 1 hour with fatigue.  Denies LOC, but had near syncopal symptoms.  No eliciting factors were present.  No flashing lights, no significantly increased stress.  Patient was going about normal daily routine.  Standing doing his job, became very hot/profuse sweating, felt like back and neck was "on fire," Got more dizzy.  Right leg had become warm because he was urinating on himself and could not control this once he realized what was happening.  Did note he may have been hyperventilating, but no chest pain/discomfort.  Some HR racing was present.  Brief period of disorientation accompanied events.  Patient had eaten around 8a and was preparing to eat lunch before the events happened around 2 pm.   - Patient has been on low FODMAP diet for GI upset is only change noted for diet. His boss was able to help walk in and get medical care.  Fiance took him to urgent care.    Social History   Tobacco Use  . Smoking status: Never Smoker  . Smokeless tobacco: Never Used  Substance Use Topics  . Alcohol use: Not on file  . Drug use: Not Currently    Types: Marijuana    Review of Systems Per HPI unless specifically indicated above     Objective:    BP 106/67 (BP Location: Right Arm, Patient Position: Sitting, Cuff Size: Large)   Temp 98.2 F (36.8 C) (Oral)   Ht 5\' 11"  (1.803 m)   Wt 226 lb 9.6 oz (102.8 kg)   BMI 31.60 kg/m   Wt Readings from Last 3 Encounters:  05/19/18 226 lb 9.6 oz (102.8 kg)  05/14/18 230  lb (104.3 kg)  05/06/18 230 lb (104.3 kg)    Physical Exam Vitals signs reviewed.  Constitutional:      General: He is not in acute distress.    Appearance: He is well-developed.  HENT:     Head: Normocephalic and atraumatic.  Cardiovascular:     Rate and Rhythm: Normal rate and regular rhythm.     Pulses:          Radial pulses are 2+ on the right side and 2+ on the left side.       Posterior tibial pulses are 1+ on the right side and 1+ on the left side.     Heart sounds: Normal heart sounds, S1 normal and S2 normal.  Pulmonary:     Effort: Pulmonary effort is normal. No respiratory distress.     Breath sounds: Normal breath sounds and air entry.  Musculoskeletal:     Right lower leg: No edema.     Left lower leg: No edema.  Skin:    General: Skin is warm and dry.     Capillary Refill: Capillary refill takes less than 2 seconds.  Neurological:     General: No focal deficit present.     Mental Status: He is alert and oriented to person, place, and time.  Mental status is at baseline.     Cranial Nerves: No cranial nerve deficit.     Sensory: No sensory deficit.     Motor: No weakness.     Gait: Gait normal.     Deep Tendon Reflexes: Reflexes are normal and symmetric.  Psychiatric:        Attention and Perception: Attention normal.        Mood and Affect: Mood and affect normal.        Behavior: Behavior normal. Behavior is cooperative.    Results for orders placed or performed during the hospital encounter of 05/14/18  Urine culture  Result Value Ref Range   Specimen Description URINE, CLEAN CATCH    Special Requests      Normal Performed at [facility], [address] [place], [place] [address]    Culture <10,000 COLONIES/mL INSIGNIFICANT GROWTH (A)    Report Status 05/16/2018 FINAL   Urinalysis, Complete w Microscopic  Result Value Ref Range   Color, Urine YELLOW YELLOW   APPearance HAZY (A) CLEAR   Specific Gravity, Urine >1.030 (H) 1.005 - 1.030    pH 5.0 5.0 - 8.0   Glucose, UA NEGATIVE NEGATIVE mg/dL   Hgb urine dipstick TRACE (A) NEGATIVE   Bilirubin Urine SMALL (A) NEGATIVE   Ketones, ur >160 (A) NEGATIVE mg/dL   Protein, ur NEGATIVE NEGATIVE mg/dL   Nitrite NEGATIVE NEGATIVE   Leukocytes, UA NEGATIVE NEGATIVE   Squamous Epithelial / LPF 0-5 0 - 5   WBC, UA 0-5 0 - 5 WBC/hpf   RBC / HPF 6-10 0 - 5 RBC/hpf   Bacteria, UA FEW (A) NONE SEEN   Mucus PRESENT    Hyaline Casts, UA PRESENT   CBC with Differential  Result Value Ref Range   WBC 10.0 4.0 - 10.5 K/uL   RBC 5.04 4.22 - 5.81 MIL/uL   Hemoglobin 16.2 13.0 - 17.0 g/dL   HCT 45.9 39.0 - 52.0 %   MCV 91.1 80.0 - 100.0 fL   MCH 32.1 26.0 - 34.0 pg   MCHC 35.3 30.0 - 36.0 g/dL   RDW 12.1 11.5 - 15.5 %   Platelets 263 150 - 400 K/uL   nRBC 0.0 0.0 - 0.2 %   Neutrophils Relative % 58 %   Neutro Abs 5.9 1.7 - 7.7 K/uL   Lymphocytes Relative 31 %   Lymphs Abs 3.1 0.7 - 4.0 K/uL   Monocytes Relative 7 %   Monocytes Absolute 0.7 0.1 - 1.0 K/uL   Eosinophils Relative 2 %   Eosinophils Absolute 0.2 0.0 - 0.5 K/uL   Basophils Relative 1 %   Basophils Absolute 0.1 0.0 - 0.1 K/uL   Immature Granulocytes 1 %   Abs Immature Granulocytes 0.05 0.00 - 0.07 K/uL  Comprehensive metabolic panel  Result Value Ref Range   Sodium 136 135 - 145 mmol/L   Potassium 4.1 3.5 - 5.1 mmol/L   Chloride 102 98 - 111 mmol/L   CO2 25 22 - 32 mmol/L   Glucose, Bld 89 70 - 99 mg/dL   BUN 15 6 - 20 mg/dL   Creatinine, Ser 0.81 0.61 - 1.24 mg/dL   Calcium 9.5 8.9 - 10.3 mg/dL   Total Protein 8.5 (H) 6.5 - 8.1 g/dL   Albumin 5.0 3.5 - 5.0 g/dL   AST 20 15 - 41 U/L   ALT 26 0 - 44 U/L   Alkaline Phosphatase 65 38 - 126 U/L   Total  Bilirubin 1.1 0.3 - 1.2 mg/dL   GFR calc non Af Amer >60 >60 mL/min   GFR calc Af Amer >60 >60 mL/min   Anion gap 9 5 - 15      Assessment & Plan:   Problem List Items Addressed This Visit    None    Visit Diagnoses    Abnormal urine findings    -   Primary   Relevant Orders   Urinalysis (Completed)   Near syncope       Relevant Orders   Ambulatory referral to Neurology   Other urinary incontinence       Relevant Orders   Ambulatory referral to Neurology    Patient with near syncope and symptoms of possible seizure.  Patient without any of these symptoms in past.  No recurrence of symptoms since events 4 days ago.  Patient also with abnormal urine test.  No signs and symptoms of infection.  Labs at time of near syncope were wnl at urgent care. - Unknown cause of near syncope, but is suggestive of possible seizure, cardiac abnormality, dehydration, or possible glucose impairment given urine ketones.  Plan: 1. Repeat urinalysis by lab today. 2. Referral neurology.   3. Encouraged good hydration. 4. FOLLOW-UP prn   Follow up plan: Return in about 6 months (around 11/17/2018) for annual physical AND as needed if symptoms return.  [name], DNP, AGPCNP-BC Adult Gerontology Primary Care Nurse Practitioner [facility] 05/19/2018, 8:11 AM

## 2018-05-20 LAB — URINALYSIS
Bilirubin Urine: NEGATIVE
Glucose, UA: NEGATIVE
Hgb urine dipstick: NEGATIVE
Ketones, ur: NEGATIVE
Leukocytes, UA: NEGATIVE
Nitrite: NEGATIVE
Protein, ur: NEGATIVE
Specific Gravity, Urine: 1.022 (ref 1.001–1.03)
pH: 6 (ref 5.0–8.0)

## 2018-05-21 ENCOUNTER — Encounter: Payer: Self-pay | Admitting: Nurse Practitioner

## 2018-05-23 DIAGNOSIS — R569 Unspecified convulsions: Secondary | ICD-10-CM | POA: Diagnosis not present

## 2018-06-20 DIAGNOSIS — R569 Unspecified convulsions: Secondary | ICD-10-CM | POA: Diagnosis not present

## 2018-07-03 ENCOUNTER — Telehealth: Payer: Self-pay

## 2018-07-03 ENCOUNTER — Other Ambulatory Visit: Payer: Self-pay

## 2018-07-03 ENCOUNTER — Ambulatory Visit (INDEPENDENT_AMBULATORY_CARE_PROVIDER_SITE_OTHER): Payer: BLUE CROSS/BLUE SHIELD | Admitting: Nurse Practitioner

## 2018-07-03 ENCOUNTER — Encounter: Payer: Self-pay | Admitting: Nurse Practitioner

## 2018-07-03 VITALS — BP 124/74 | HR 86 | Temp 98.3°F | Resp 17 | Ht 71.0 in | Wt 221.0 lb

## 2018-07-03 DIAGNOSIS — R6889 Other general symptoms and signs: Secondary | ICD-10-CM | POA: Diagnosis not present

## 2018-07-03 LAB — POCT INFLUENZA A/B
Influenza A, POC: NEGATIVE
Influenza B, POC: NEGATIVE

## 2018-07-03 MED ORDER — BALOXAVIR MARBOXIL(80 MG DOSE) 2 X 40 MG PO TBPK: 80.0000 mg | ORAL_TABLET | Freq: Once | ORAL | 0 refills | Status: AC

## 2018-07-03 NOTE — Progress Notes (Signed)
Subjective:    Patient ID: Jacob Andrews, male    DOB: 21-Nov-1985, 33 y.o.   MRN: [id number]  Jacob Andrews is a 33 y.o. male presenting on 07/03/2018 for Cough (sore throat, bodyaches, sweating, runny nose, fatigue and diarrhea x 1 day)   HPI Cough/Flu-like symptoms Patient has no significantly elevated fever.  Flu testing of his fiance was negative, but she also had symptoms of flu this past week.  They both work in [employer] [facility] where there are few cases of [place] currently.  No close contact with anyone diagnosed with Covid-19.  Constantly touching money - tries to take precautions. - Patients symptoms started 1 day ago and include sore throat, body aches, sweats, rhinorrhea, fatigue, diarrhea. - Denies tooth/jaw pain, ear pain/pressure, itchy/watery eyes.  - Patient has taken Tylenol - is helping some.  Social History   Tobacco Use  . Smoking status: Never Smoker  . Smokeless tobacco: Never Used  Substance Use Topics  . Alcohol use: Not on file  . Drug use: Not Currently    Types: Marijuana    Review of Systems Per HPI unless specifically indicated above     Objective:    BP 124/74 (BP Location: Left Arm, Patient Position: Sitting, Cuff Size: Normal)   Pulse 86   Temp 98.3 F (36.8 C) (Oral)   Resp 17   Ht 5\' 11"  (1.803 m)   Wt 221 lb (100.2 kg)   SpO2 99%   BMI 30.82 kg/m   Wt Readings from Last 3 Encounters:  07/03/18 221 lb (100.2 kg)  05/19/18 226 lb 9.6 oz (102.8 kg)  05/14/18 230 lb (104.3 kg)    Physical Exam Vitals signs reviewed.  Constitutional:      Appearance: He is well-developed.  HENT:     Head: Normocephalic and atraumatic.     Right Ear: Hearing, tympanic membrane, ear canal and external ear normal.     Left Ear: Hearing, tympanic membrane, ear canal and external ear normal.     Nose: Mucosal edema and rhinorrhea present.     Right Sinus: No maxillary sinus tenderness or frontal sinus tenderness.     Left Sinus: No maxillary sinus  tenderness or frontal sinus tenderness.     Mouth/Throat:     Lips: Pink.     Mouth: Mucous membranes are moist.     Pharynx: Uvula midline. Pharyngeal swelling (cobblestoning) and posterior oropharyngeal erythema (mildly injected) present. No oropharyngeal exudate (clear secretions) or uvula swelling.     Tonsils: No tonsillar exudate. 1+ on the right. 1+ on the left.  Eyes:     General: Lids are normal.        Right eye: No discharge.        Left eye: No discharge.     Conjunctiva/sclera: Conjunctivae normal.     Pupils: Pupils are equal, round, and reactive to light.  Neck:     Musculoskeletal: Full passive range of motion without pain, normal range of motion and neck supple.  Cardiovascular:     Rate and Rhythm: Normal rate and regular rhythm.     Pulses: Normal pulses.     Heart sounds: Normal heart sounds, S1 normal and S2 normal.  Pulmonary:     Effort: Pulmonary effort is normal. No respiratory distress.     Breath sounds: Normal breath sounds.  Musculoskeletal:     Right lower leg: No edema.     Left lower leg: No edema.  Lymphadenopathy:  Cervical: Cervical adenopathy present.     Right cervical: Superficial cervical adenopathy present. No deep or posterior cervical adenopathy.    Left cervical: Superficial cervical adenopathy present. No deep or posterior cervical adenopathy.  Skin:    General: Skin is warm and dry.     Capillary Refill: Capillary refill takes less than 2 seconds.  Neurological:     General: No focal deficit present.     Mental Status: He is alert.     GCS: GCS eye subscore is 4. GCS verbal subscore is 5. GCS motor subscore is 6.  Psychiatric:        Attention and Perception: Attention normal.        Mood and Affect: Mood normal.        Behavior: Behavior normal. Behavior is cooperative.    Results for orders placed or performed in visit on 05/19/18  Urinalysis  Result Value Ref Range   Color, Urine YELLOW YELLOW   APPearance CLEAR CLEAR    Specific Gravity, Urine 1.022 1.001 - 1.03   pH 6.0 5.0 - 8.0   Glucose, UA NEGATIVE NEGATIVE   Bilirubin Urine NEGATIVE NEGATIVE   Ketones, ur NEGATIVE NEGATIVE   Hgb urine dipstick NEGATIVE NEGATIVE   Protein, ur NEGATIVE NEGATIVE   Nitrite NEGATIVE NEGATIVE   Leukocytes, UA NEGATIVE NEGATIVE      Assessment & Plan:    Clinically diagnosed influenza despite negative rapid flu test today, concern for flu still due to symptoms.  Cannot fully exclude Covid-19 if not improving in next 2-3 days.  Exposure to spouse with dx of flu without confirmed influenza. - Duration x 1 days, without complication. Tolerating PO and well hydrated - No other focal findings of infection today - S/p influenza vaccine this season  Plan: 1. Start Xofluza 80 mg once.  Take 2 40mg  capsules for one dose today.  2. Supportive care as advised with NSAID / Tylenol PRN fever/myalgias, improve hydration, may take OTC Cold/Flu meds 3. Encouraged patient to self-quarantine for 14 days or until symptom free for 3 days, and free of respiratory symptoms x 7 days.  4. Return criteria given if significant worsening, consider post-influenza complications, otherwise follow-up if needed    Meds ordered this encounter  Medications  . Baloxavir Marboxil,80 MG Dose, (XOFLUZA) 2 x 40 MG TBPK    Sig: Take 80 mg by mouth once for 1 dose.    Dispense:  2 each    Refill:  0    Order Specific Question:   Supervising Provider    Answer:   Olin Hauser [[id number]]    Follow up plan: Follow-up prn if symptoms no improving 5-7 days via phone.  [name], DNP, AGPCNP-BC Adult Gerontology Primary Care Nurse Practitioner [facility] 07/03/2018, 3:11 PM

## 2018-07-03 NOTE — Telephone Encounter (Signed)
The pt called complaining of scratchy throat, fatigue, sweating, low-grade fever 99.5 and mild cough. The pt states his symptoms developed last night. His finance was recently treated for possible flu last week. Please advise

## 2018-07-03 NOTE — Telephone Encounter (Signed)
Patient could come in and be tested for flu today.  We can provide treatment if needed with  Tamiflu or Xofluza.  She was treated for negative flu test and presumed flu symptoms after reviewing her encounter.  Again, symptoms will resolve without an appointment and supportive therapy in about 7-10 days, but we can provide treatment if tested positive or evaluated in clinic.

## 2018-07-03 NOTE — Telephone Encounter (Signed)
The pt was notified. He verbalize understanding and is scheduled for an appt today.

## 2018-07-03 NOTE — Patient Instructions (Signed)
Jacob Andrews,   Thank you for coming in to clinic today.  1. START Xofluza 40 mg capsules - Take 2 caps by mouth today for one dose. - Your flu test was NEGATIVE.  It is possible you can still have the flu with a negative test, otherwise it could be a different virus causing your symptoms.  Your test is negative for Influenza A and B today (rapid flu swab test in office) - Wash hands and cover cough very well to avoid spread of infection - For symptom control:      - Take Ibuprofen / Advil 400-600mg  every 6-8 hours as needed for fever / muscle aches.  You may also take Tylenol 500-1000mg  per dose every 6-8 hours or 3 times a day.  You can alternate dosing and take both in the same day.      - Do not take more than 3,000 mg acetaminophen (Tylenol) in a day.      - Start OTC Mucinex-DM for cough and congestion for up to 7 days. - Improve hydration with plenty of clear fluids.  Drink up to 8 glasses of water / fluids each day.  If significant worsening with poor fluid intake, worsening fever, difficulty breathing due to coughing, worsening body aches, weakness, or other more concerning symptoms difficulty breathing you can seek treatment at Emergency Department. If your flu symptoms have improved and then get worse several days to a week later with concerns for bronchitis, productive cough, fever, and chills we may need to check for possible pneumonia that can occur after the flu.  Please schedule a follow-up appointment with [name], AGNP in 1-2 weeks as needed if worsening from Flu / Bronchitis   If you have any other questions or concerns, please feel free to call the clinic or send a message through [facility]. You may also schedule an earlier appointment if necessary.  You will receive a survey after today's visit either digitally by e-mail or paper by [employer]. Your experiences and feedback matter to [place].  Please respond so we know how we are doing as we provide care for  you.   [name], DNP, AGNP-BC Adult Gerontology Nurse Practitioner [place]

## 2018-07-06 ENCOUNTER — Encounter: Payer: Self-pay | Admitting: Nurse Practitioner

## 2018-07-16 ENCOUNTER — Other Ambulatory Visit: Payer: Self-pay | Admitting: Family Medicine

## 2018-07-16 DIAGNOSIS — R112 Nausea with vomiting, unspecified: Secondary | ICD-10-CM

## 2018-07-24 DIAGNOSIS — R569 Unspecified convulsions: Secondary | ICD-10-CM | POA: Diagnosis not present

## 2019-03-28 DIAGNOSIS — Z20828 Contact with and (suspected) exposure to other viral communicable diseases: Secondary | ICD-10-CM | POA: Diagnosis not present

## 2019-06-26 ENCOUNTER — Ambulatory Visit
Admission: EM | Admit: 2019-06-26 | Discharge: 2019-06-26 | Disposition: A | Discharge status: Home / Self Care | Payer: Self-pay | Attending: Family Medicine | Admitting: Family Medicine

## 2019-06-26 ENCOUNTER — Other Ambulatory Visit: Payer: Self-pay

## 2019-06-26 ENCOUNTER — Ambulatory Visit (INDEPENDENT_AMBULATORY_CARE_PROVIDER_SITE_OTHER): Payer: Self-pay

## 2019-06-26 DIAGNOSIS — R112 Nausea with vomiting, unspecified: Secondary | ICD-10-CM

## 2019-06-26 DIAGNOSIS — R109 Unspecified abdominal pain: Secondary | ICD-10-CM

## 2019-06-26 DIAGNOSIS — R197 Diarrhea, unspecified: Secondary | ICD-10-CM

## 2019-06-26 MED ORDER — PANTOPRAZOLE SODIUM 40 MG PO TBEC: 40.0000 mg | DELAYED_RELEASE_TABLET | Freq: Every day | ORAL | 0 refills | Status: DC

## 2019-06-26 MED ORDER — ONDANSETRON HCL 4 MG PO TABS: 4.0000 mg | ORAL_TABLET | Freq: Three times a day (TID) | ORAL | 0 refills | Status: DC | PRN

## 2019-06-26 NOTE — ED Provider Notes (Signed)
[facility]    CSN: [id number] Arrival date & time: 06/26/19  0836      History   Chief Complaint Chief Complaint  Patient presents with  . Diarrhea    HPI Jacob Andrews is a 34 y.o. male.   Jacob Andrews presents with complaints of vomiting and diarrhea. He has had these symptoms for years now, however, has noted worsening of symptoms over the past week. Has had 2-3 episodes of diarrhea this past week, overnight, which wakes him. Sometimes he vomits at the same time he has diarrhea. He can stay on the toilet overnight for 40 minutes at a time with liquid and mixed "soft" stool. Denies mucus, blood or black to stool. He describes emesis as "typical vomit" and undigested food, denies fecal appearing or smelling, denies blood or black to emesis. Certain foods can trigger exacerbation of symptoms. He states he is unable to tolerate lactose and takes lactaid pills if he does intake lactose. No other medications for symptoms. He no longer uses fiber supplementation or imodium. Has tried pro biotics in the past as well which do not help. Hasn't followed with GI- Dr. Allen Norris-  in a few years. Last colonoscopy in 2018. Initial thought by GI was crohn's, but biopsy did not support this. Saw his PCP last a year ago. Denies any change in weight or change in diet. Doesn't drink alcohol or smoke cigarettes. States he smokes marijuana a few times a month, which does seem to help his symptoms.  Previously these symptoms have been diagnosed as IBS.     ROS per HPI, negative if not otherwise mentioned.      Past Medical History:  Diagnosis Date  . Bone spur    right knee  . Depression   . IBS (irritable bowel syndrome)   . Kidney stone    current stone outside kidney after surgery.   . OSA (obstructive sleep apnea)    no CPAP- could not get "adjusted" correctly  . Vertigo     Patient Active Problem List   Diagnosis Date Noted  . Noninfectious diarrhea   . Polyp of sigmoid colon    . Benign neoplasm of transverse colon   . Anxiety 11/12/2016  . RUQ pain 10/09/2016  . Right knee pain 07/09/2016  . Sleep apnea in adult 11/29/2014  . Irritable bowel syndrome (IBS) 11/08/2014  . Depression 11/08/2014  . Lactose intolerance in adult 11/08/2014    Past Surgical History:  Procedure Laterality Date  . APPENDECTOMY  2013  . COLONOSCOPY WITH PROPOFOL N/A 12/31/2016   Procedure: COLONOSCOPY WITH PROPOFOL;  Surgeon: Lucilla Lame, MD;  Location: [place];  Service: Gastroenterology;  Laterality: N/A;  sleep apnea  . KIDNEY SURGERY  2010  . POLYPECTOMY  12/31/2016   Procedure: POLYPECTOMY;  Surgeon: Lucilla Lame, MD;  Location: [place];  Service: Gastroenterology;;  . URETERAL STENT PLACEMENT  2012       Home Medications    Prior to Admission medications   Medication Sig Start Date End Date Taking? Authorizing Provider  busPIRone (BUSPAR) 10 MG tablet Take 1 tablet (10 mg total) by mouth 2 (two) times daily. 01/27/18   Mikey College, NP  escitalopram (LEXAPRO) 10 MG tablet Take 1 tablet (10 mg total) by mouth daily. 01/27/18   Mikey College, NP  fluticasone (FLONASE) 50 MCG/ACT nasal spray 2 sprays by Nasal route once daily. 07/09/16   [provider]  Loperamide HCl (IMODIUM PO) Take  by mouth.    [provider]  ondansetron (ZOFRAN) 4 MG tablet Take 1 tablet (4 mg total) by mouth every 8 (eight) hours as needed for nausea or vomiting. 06/26/19   Zigmund Gottron, NP  pantoprazole (PROTONIX) 40 MG tablet Take 1 tablet (40 mg total) by mouth daily before breakfast. 06/26/19   Zigmund Gottron, NP    Family History Family History  Problem Relation Age of Onset  . Diabetes Father   . Hypertension Mother   . Heart disease Maternal Grandmother        heart attack  . Heart disease Paternal Grandfather   . Stroke Paternal Grandfather     Social History Social History   Tobacco Use  . Smoking status: Never  Smoker  . Smokeless tobacco: Never Used  Substance Use Topics  . Alcohol use: Yes    Alcohol/week: 0.0 standard drinks  . Drug use: Yes    Types: Marijuana     Allergies   Cinnamon and Oxycodone   Review of Systems Review of Systems   Physical Exam Triage Vital Signs ED Triage Vitals  Enc Vitals Group     BP 06/26/19 0855 128/82     Pulse Rate 06/26/19 0855 91     Resp 06/26/19 0855 18     Temp 06/26/19 0855 98.4 F (36.9 C)     Temp Source 06/26/19 0855 Oral     SpO2 06/26/19 0855 99 %     Weight 06/26/19 0852 225 lb (102.1 kg)     Height 06/26/19 0852 5\' 11"  (1.803 m)     Head Circumference --      Peak Flow --      Pain Score 06/26/19 0852 7     Pain Loc --      Pain Edu? --      Excl. in [address]? --    No data found.  Updated Vital Signs BP 128/82 (BP Location: Right Arm)   Pulse 91   Temp 98.4 F (36.9 C) (Oral)   Resp 18   Ht 5\' 11"  (1.803 m)   Wt 225 lb (102.1 kg)   SpO2 99%   BMI 31.38 kg/m    Physical Exam Constitutional:      Appearance: He is well-developed.  Cardiovascular:     Rate and Rhythm: Normal rate.  Pulmonary:     Effort: Pulmonary effort is normal.  Abdominal:     Tenderness: There is no abdominal tenderness.     Comments: No current abdominal pain, endorses some nausea and heartburn symptoms currently   Skin:    General: Skin is warm and dry.  Neurological:     Mental Status: He is alert and oriented to person, place, and time.      UC Treatments / Results  Labs (all labs ordered are listed, but only abnormal results are displayed) Labs Reviewed - No data to display  EKG   Radiology DG Abd 1 View  Result Date: 06/26/2019 CLINICAL DATA:  Vomiting and diarrhea for 1 week EXAM: ABDOMEN - 1 VIEW COMPARISON:  06/06/2011 FINDINGS: Nonobstructive pattern of bowel gas with scattered colonic gas present to the rectum. No large burden of stool in the colon. No fluid distended loops of small bowel to suggest obstruction. No  radio-opaque calculi or other significant radiographic abnormality are seen. IMPRESSION: Nonobstructive pattern of bowel gas with scattered colonic gas present to the rectum. Electronically Signed   By: Eddie Candle M.D.   On: 06/26/2019  09:45    Procedures Procedures (including critical care time)  Medications Ordered in UC Medications - No data to display  Initial Impression / Assessment and Plan / UC Course  I have reviewed the triage vital signs and the nursing notes.  Pertinent labs & imaging results that were available during my care of the patient were reviewed by me and considered in my medical decision making (see chart for details).     Non toxic. Benign physical exam. Unfortunately patient's symptoms do appear to be chronic in nature. KUB without acute findings. Restarted protonix and zofran. Encourage food/ symptom diary. Encouraged restart of fiber supplementation. Limit marijuana use. Encouraged follow up with GI for further evaluation and management. Return precautions provided. Patient verbalized understanding and agreeable to plan.  Ambulatory out of clinic without difficulty.    Final Clinical Impressions(s) / UC Diagnoses   Final diagnoses:  Nausea vomiting and diarrhea     Discharge Instructions     Can restart daily protonix.  Zofran every 8 hours as needed for nausea or vomiting.   I recommend a fairly detailed diet and symptom journal to provide to GI.  Fiber supplementation for stool bulking, such as metamucil. Drink plenty of water to ensure adequate hydration. I do recommend return follow up with GI for recheck.  If develop worsening of abdominal pain, fevers, dizziness, weakness, unable to urinate please return or go to the ER.      ED Prescriptions    Medication Sig Dispense Auth. Provider   pantoprazole (PROTONIX) 40 MG tablet Take 1 tablet (40 mg total) by mouth daily before breakfast. 30 tablet Sharry Beining B, NP   ondansetron (ZOFRAN) 4 MG  tablet Take 1 tablet (4 mg total) by mouth every 8 (eight) hours as needed for nausea or vomiting. 60 tablet Zigmund Gottron, NP     PDMP not reviewed this encounter.   Zigmund Gottron, NP 06/26/19 1016

## 2019-06-26 NOTE — Discharge Instructions (Addendum)
Can restart daily protonix.  Zofran every 8 hours as needed for nausea or vomiting.   I recommend a fairly detailed diet and symptom journal to provide to GI.  Fiber supplementation for stool bulking, such as metamucil. Drink plenty of water to ensure adequate hydration. I do recommend return follow up with GI for recheck.  If develop worsening of abdominal pain, fevers, dizziness, weakness, unable to urinate please return or go to the ER.

## 2019-06-26 NOTE — ED Triage Notes (Signed)
Patient complains of diarrhea every day for 3 years. States that this wakes him out of sleep at 4am everyday. Patient reports that he now experiences vomiting with this as well.

## 2019-07-10 DIAGNOSIS — Z23 Encounter for immunization: Secondary | ICD-10-CM | POA: Diagnosis not present

## 2019-07-31 DIAGNOSIS — Z23 Encounter for immunization: Secondary | ICD-10-CM | POA: Diagnosis not present

## 2019-10-16 DIAGNOSIS — J029 Acute pharyngitis, unspecified: Secondary | ICD-10-CM | POA: Diagnosis not present

## 2019-10-16 DIAGNOSIS — Z20822 Contact with and (suspected) exposure to covid-19: Secondary | ICD-10-CM | POA: Diagnosis not present

## 2020-01-26 ENCOUNTER — Other Ambulatory Visit: Payer: Self-pay

## 2020-01-26 ENCOUNTER — Ambulatory Visit (INDEPENDENT_AMBULATORY_CARE_PROVIDER_SITE_OTHER): Payer: BC Managed Care – PPO | Admitting: Family Medicine

## 2020-01-26 ENCOUNTER — Encounter: Payer: Self-pay | Admitting: Family Medicine

## 2020-01-26 VITALS — BP 135/72 | HR 92 | Temp 98.7°F | Ht 71.0 in | Wt 237.2 lb

## 2020-01-26 DIAGNOSIS — M5416 Radiculopathy, lumbar region: Secondary | ICD-10-CM | POA: Diagnosis not present

## 2020-01-26 DIAGNOSIS — M5441 Lumbago with sciatica, right side: Secondary | ICD-10-CM | POA: Diagnosis not present

## 2020-01-26 DIAGNOSIS — M722 Plantar fascial fibromatosis: Secondary | ICD-10-CM | POA: Diagnosis not present

## 2020-01-26 LAB — POCT URINALYSIS DIPSTICK
Bilirubin, UA: NEGATIVE
Blood, UA: NEGATIVE
Glucose, UA: NEGATIVE
Ketones, UA: NEGATIVE
Leukocytes, UA: NEGATIVE
Nitrite, UA: NEGATIVE
Protein, UA: NEGATIVE
Spec Grav, UA: <=1.005 — AB (ref 1.010–1.025)
Urobilinogen, UA: 0.2 E.U./dL
pH, UA: 5 (ref 5.0–8.0)

## 2020-01-26 MED ORDER — CYCLOBENZAPRINE HCL 10 MG PO TABS: 5.0000 mg | ORAL_TABLET | Freq: Three times a day (TID) | ORAL | 0 refills | Status: DC | PRN

## 2020-01-26 MED ORDER — PREDNISONE 50 MG PO TABS: ORAL_TABLET | ORAL | 0 refills | Status: DC

## 2020-01-26 NOTE — Assessment & Plan Note (Signed)
Likely plantar fasciitis based on symptoms.  Discussed arch support and stretches off stairs to do calf raises to help stretch the plantar fascia.

## 2020-01-26 NOTE — Progress Notes (Signed)
Subjective:    Patient ID: Jacob Andrews, male    DOB: 1985/08/18, 34 y.o.   MRN: [id number]  Jacob Andrews is a 34 y.o. male presenting on 01/26/2020 for Back Pain (intermittent back pain flare up normally every year.Marland Kitchen He state he remember mowing the grass x 1 mth ago. He was pushing the lawnmower up a hill and he felt a  pain in his lower back that caused him some immediate discomfort that seem to worsen as time persist. He state ambulation, sitting, and standing makes the pain worse. The radiates from the lower back down the Rt leg. He describe it as a throbbing and sharp pain with certain movements. Pt denies urinary issues, but reports he have history of kidney stones)   HPI  Jacob Andrews presents to clinic with concerns of back pain x 1 month.  Reports he was mowing his lawn and was pushing the lawn mower up hill when he felt an increase in right lower back pain and a popping sensation.  States he typically has lower back pain once per year with a pulled muscle but this is different.  He has continued pain with ambulation, sitting, and standing can all make his pain worse.  He has new onset of radiation of pain, numbness, tingling down his right leg.  Denies any dysuria, urinary frequency, urgency, hesitancy, change in bowel/bladder function, saddle anesthesia, or foot drop.  Reports history of kidney stones yearly.  Has concerns for bilateral foot pain daily, that worsens as the day progresses, finds he is taking weight off of his feet for them to feel better at the end of the day.  Depression screen [facility] 2/9 05/06/2018 05/06/2018 03/18/2017  Decreased Interest 1 1 1   Down, Depressed, Hopeless 0 0 1  PHQ - 2 Score 1 1 2   Altered sleeping - - 0  Tired, decreased energy - - 1  Change in appetite - - 0  Feeling bad or failure about yourself  - - 1  Trouble concentrating - - 0  Moving slowly or fidgety/restless - - 0  Suicidal thoughts - - -  PHQ-9 Score - - 4  Difficult doing work/chores - -  Not difficult at all  Some recent data might be hidden    Social History   Tobacco Use  . Smoking status: Never Smoker  . Smokeless tobacco: Never Used  Vaping Use  . Vaping Use: Never used  Substance Use Topics  . Alcohol use: Yes    Alcohol/week: 0.0 standard drinks  . Drug use: Yes    Types: Marijuana    Review of Systems  Constitutional: Negative.   HENT: Negative.   Eyes: Negative.   Respiratory: Negative.   Cardiovascular: Negative.   Gastrointestinal: Negative.   Endocrine: Negative.   Genitourinary: Negative.   Musculoskeletal: Positive for back pain. Negative for arthralgias, gait problem, joint swelling, myalgias, neck pain and neck stiffness.  Skin: Negative.   Allergic/Immunologic: Negative.   Neurological: Positive for numbness. Negative for dizziness, tremors, seizures, syncope, facial asymmetry, speech difficulty, weakness, light-headedness and headaches.  Hematological: Negative.   Psychiatric/Behavioral: Negative.    Per HPI unless specifically indicated above     Objective:    BP 135/72 (BP Location: Right Arm, Patient Position: Sitting, Cuff Size: Large)   Pulse 92   Temp 98.7 F (37.1 C) (Oral)   Ht 5\' 11"  (1.803 m)   Wt 237 lb 3.2 oz (107.6 kg)   SpO2 99%   BMI  33.08 kg/m   Wt Readings from Last 3 Encounters:  01/26/20 237 lb 3.2 oz (107.6 kg)  06/26/19 225 lb (102.1 kg)  07/03/18 221 lb (100.2 kg)    Physical Exam Vitals and nursing note reviewed.  Constitutional:      General: He is not in acute distress.    Appearance: Normal appearance. He is well-developed and well-groomed. He is obese. He is not ill-appearing or toxic-appearing.  HENT:     Head: Normocephalic and atraumatic.     Nose:     Comments: [name] is in place, covering mouth and nose. Eyes:     General:        Right eye: No discharge.        Left eye: No discharge.     Extraocular Movements: Extraocular movements intact.     Conjunctiva/sclera: Conjunctivae  normal.     Pupils: Pupils are equal, round, and reactive to light.  Cardiovascular:     Pulses: Normal pulses.  Pulmonary:     Effort: Pulmonary effort is normal. No respiratory distress.  Musculoskeletal:     Cervical back: Normal.     Thoracic back: Normal.     Lumbar back: Spasms present. No swelling, deformity, lacerations or tenderness. Positive right straight leg raise test. Negative left straight leg raise test.     Right lower leg: No edema.     Left lower leg: No edema.  Skin:    General: Skin is warm and dry.     Capillary Refill: Capillary refill takes less than 2 seconds.  Neurological:     General: No focal deficit present.     Mental Status: He is alert and oriented to person, place, and time.  Psychiatric:        Attention and Perception: Attention and perception normal.        Mood and Affect: Mood and affect normal.        Speech: Speech normal.        Behavior: Behavior normal. Behavior is cooperative.        Thought Content: Thought content normal.        Cognition and Memory: Cognition and memory normal.    Results for orders placed or performed in visit on 01/26/20  POCT Urinalysis Dipstick  Result Value Ref Range   Color, UA yellow    Clarity, UA clear    Glucose, UA Negative Negative   Bilirubin, UA negative    Ketones, UA negative    Spec Grav, UA <=1.005 (A) 1.010 - 1.025   Blood, UA negative    pH, UA 5.0 5.0 - 8.0   Protein, UA Negative Negative   Urobilinogen, UA 0.2 0.2 or 1.0 E.U./dL   Nitrite, UA negative    Leukocytes, UA Negative Negative   Appearance     Odor        Assessment & Plan:   Problem List Items Addressed This Visit      Nervous and Auditory   Acute right-sided low back pain with right-sided sciatica - Primary    Right sided low back pain with radiculopathy.  Discussed causes of radiculopathy, that prednisone can help with reduction of inflammation, but once completes prednisone, the radicular symptoms may return.   Discussed if they do return, would likely need to follow with Orthopedics, where they can order an MRI to look for nerve compression/disc concerns.  Provided with back exercises.  Will send in prescription for prednisone 50mg  daily x 5 days and cyclobenzaprine 5-10mg   TID PRN for muscle cramp/spasm.  Plan: 1. To begin prednisone 50mg  tablet, daily x 5 days 2. To take cyclobenzaprine 5-10mg  TID PRN for muscle cramp/spasm 3. To avoid all NSAIDs while taking prednisone, as can cause stomach upset 4. Work on back exercises/stretches while on these medications 5. RTC PRN, if would prefer, can proceed to Orthopedics for evaluation as discussed      Relevant Medications   predniSONE (DELTASONE) 50 MG tablet   cyclobenzaprine (FLEXERIL) 10 MG tablet   Other Relevant Orders   POCT Urinalysis Dipstick (Completed)   Right lumbar radiculopathy    See acute right-sided low back pain with right-sided sciatica A/P      Relevant Medications   predniSONE (DELTASONE) 50 MG tablet   cyclobenzaprine (FLEXERIL) 10 MG tablet     Musculoskeletal and Integument   Plantar fasciitis    Likely plantar fasciitis based on symptoms.  Discussed arch support and stretches off stairs to do calf raises to help stretch the plantar fascia.          Meds ordered this encounter  Medications  . predniSONE (DELTASONE) 50 MG tablet    Sig: Take 1 tablet daily x 5 days    Dispense:  5 tablet    Refill:  0  . cyclobenzaprine (FLEXERIL) 10 MG tablet    Sig: Take 0.5-1 tablets (5-10 mg total) by mouth 3 (three) times daily as needed for muscle spasms.    Dispense:  30 tablet    Refill:  0    Follow up plan: Return if symptoms worsen or fail to improve.   [name], [facility] Nurse Practitioner [facility] 01/26/2020, 12:30 PM

## 2020-01-26 NOTE — Assessment & Plan Note (Signed)
See acute right sided low back pain with right sided sciatica AP 

## 2020-01-26 NOTE — Patient Instructions (Addendum)
I have sent in a prescription for prednisone 50mg  to take 1 tablet daily for the next 5 days.  Be sure to avoid NSAIDs (ibuprofen, advil, aleve, naproxen, BC, Goody's powder) while you are taking the prednisone as they can cause stomach upset if taken together.  I have sent in a prescription for cyclobenzaprine 10mg  to take 1/2-1 tablet (5-10mg ) every 8 hours as needed for muscle cramp/spasm.  Be sure to take your first dose when you are home and avoid driving or operating heavy machinery as this medication can be sedating.  If you find you have finished the medications and the radiculopathy has continued, can be seen with Urgent Care at [facility].  They are located at [address] in [place].  They are open for Urgent Care Walk In Hours Monday-Friday 2pm-7:30pm.  Can look into [employer] or any running shoe store to help with orthotics for assistance with arch support.  This may help your end of day foot discomfort.  Can use a stair, putting your toes on the stair, and do calf raises as I had shown you in clinic to help stretch the plantar fascia.             Low Back Pain Exercises  See other page with pictures of each exercise.  Start with 1 or 2 of these exercises that you are most comfortable with. Do not do any exercises that cause you significant worsening pain. Some of these may cause some "stretching soreness" but it should go away after you stop the exercise, and get better over time. Gradually increase up to 3-4 exercises as tolerated.  Standing hamstring stretch: Place the heel of your leg on a stool about 15 inches high. Keep your knee straight. Lean forward, bending at the hips until you feel a mild stretch in the back of your thigh. Make sure you do not roll your shoulders and bend at the waist when doing this or you will stretch your lower back instead. Hold the stretch for 15 to 30 seconds. Repeat 3 times. Repeat the same stretch on your other  leg.  Cat and camel: Get down on your hands and knees. Let your stomach sag, allowing your back to curve downward. Hold this position for 5 seconds. Then arch your back and hold for 5 seconds. Do 3 sets of 10.  Quadriped Arm/Leg Raises: Get down on your hands and knees. Tighten your abdominal muscles to stiffen your spine. While keeping your abdominals tight, raise one arm and the opposite leg away from you. Hold this position for 5 seconds. Lower your arm and leg slowly and alternate sides. Do this 10 times on each side.  Pelvic tilt: Lie on your back with your knees bent and your feet flat on the floor. Tighten your abdominal muscles and push your lower back into the floor. Hold this position for 5 seconds, then relax. Do 3 sets of 10.  Partial curl: Lie on your back with your knees bent and your feet flat on the floor. Tighten your stomach muscles and flatten your back against the floor. Tuck your chin to your chest. With your hands stretched out in front of you, curl your upper body forward until your shoulders clear the floor. Hold this position for 3 seconds. Don't hold your breath. It helps to breathe out as you lift your shoulders up. Relax. Repeat 10 times. Build to 3 sets of 10. To challenge yourself, clasp your hands behind your head and keep  your elbows out to the side.  Lower trunk rotation: Lie on your back with your knees bent and your feet flat on the floor. Tighten your abdominal muscles and push your lower back into the floor. Keeping your shoulders down flat, gently rotate your legs to one side, then the other as far as you can. Repeat 10 to 20 times.  Single knee to chest stretch: Lie on your back with your legs straight out in front of you. Bring one knee up to your chest and grasp the back of your thigh. Pull your knee toward your chest, stretching your buttock muscle. Hold this position for 15 to 30 seconds and return to the starting position. Repeat 3 times on each side.  Double  knee to chest: Lie on your back with your knees bent and your feet flat on the floor. Tighten your abdominal muscles and push your lower back into the floor. Pull both knees up to your chest. Hold for 5 seconds and repeat 10 to 20 times.  We will plan to see you back if your symptoms worsen or fail to improve  You will receive a survey after today's visit either digitally by e-mail or paper by USPS mail. Your experiences and feedback matter to [place].  Please respond so we know how we are doing as we provide care for you.  Call us with any questions/concerns/needs.  It is my goal to be available to you for your health concerns.  Thanks for choosing me to be a partner in your healthcare needs!  [name], FNP-C Family Nurse Practitioner [facility] Phone: [phone]   Radicular Pain Radicular pain is a type of pain that spreads from your back or neck along a spinal nerve. Spinal nerves are nerves that leave the spinal cord and go to the muscles. Radicular pain is sometimes called radiculopathy, radiculitis, or a pinched nerve. When you have this type of pain, you may also have weakness, numbness, or tingling in the area of your body that is supplied by the nerve. The pain may feel sharp and burning. Depending on which spinal nerve is affected, the pain may occur in the:  Neck area (cervical radicular pain). You may also feel pain, numbness, weakness, or tingling in the arms.  Mid-spine area (thoracic radicular pain). You would feel this pain in the back and chest. This type is rare.  Lower back area (lumbar radicular pain). You would feel this pain as low back pain. You may feel pain, numbness, weakness, or tingling in the buttocks or legs. Sciatica is a type of lumbar radicular pain that shoots down the back of the leg. Radicular pain occurs when one of the spinal nerves becomes irritated or squeezed (compressed). It is often caused by  something pushing on a spinal nerve, such as one of the bones of the spine (vertebrae) or one of the round cushions between vertebrae (intervertebral disks). This can result from:  An injury.  Wear and tear or aging of a disk.  The growth of a bone spur that pushes on the nerve. Radicular pain often goes away when you follow instructions from your health care provider for relieving pain at home. Follow these instructions at home: Managing pain      If directed, put ice on the affected area: ? Put ice in a plastic bag. ? Place a towel between your skin and the bag. ? Leave the ice on for 20 minutes, 2-3  times a day.  If directed, apply heat to the affected area as often as told by your health care provider. Use the heat source that your health care provider recommends, such as a moist heat pack or a heating pad. ? Place a towel between your skin and the heat source. ? Leave the heat on for 20-30 minutes. ? Remove the heat if your skin turns bright red. This is especially important if you are unable to feel pain, heat, or cold. You may have a greater risk of getting burned. Activity   Do not sit or rest in bed for long periods of time.  Try to stay as active as possible. Ask your health care provider what type of exercise or activity is best for you.  Avoid activities that make your pain worse, such as bending and lifting.  Do not lift anything that is heavier than 10 lb (4.5 kg), or the limit that you are told, until your health care provider says that it is safe.  Practice using proper technique when lifting items. Proper lifting technique involves bending your knees and rising up.  Do strength and range-of-motion exercises only as told by your health care provider or physical therapist. General instructions  Take over-the-counter and prescription medicines only as told by your health care provider.  Pay attention to any changes in your symptoms.  Keep all follow-up visits  as told by your health care provider. This is important. ? Your health care provider may send you to a physical therapist to help with this pain. Contact a health care provider if:  Your pain and other symptoms get worse.  Your pain medicine is not helping.  Your pain has not improved after a few weeks of home care.  You have a fever. Get help right away if:  You have severe pain, weakness, or numbness.  You have difficulty with bladder or bowel control. Summary  Radicular pain is a type of pain that spreads from your back or neck along a spinal nerve.  When you have radicular pain, you may also have weakness, numbness, or tingling in the area of your body that is supplied by the nerve.  The pain may feel sharp or burning.  Radicular pain may be treated with ice, heat, medicines, or physical therapy. This information is not intended to replace advice given to you by your health care provider. Make sure you discuss any questions you have with your health care provider. Document Revised: 10/15/2017 Document Reviewed: 10/15/2017 Elsevier Patient Education  [place].  Plantar Fasciitis  Plantar fasciitis is a painful foot condition that affects the heel. It occurs when the band of tissue that connects the toes to the heel bone (plantar fascia) becomes irritated. This can happen as the result of exercising too much or doing other repetitive activities (overuse injury). The pain from plantar fasciitis can range from mild irritation to severe pain that makes it difficult to walk or move. The pain is usually worse in the morning after sleeping, or after sitting or lying down for a while. Pain may also be worse after long periods of walking or standing. What are the causes? This condition may be caused by:  Standing for long periods of time.  Wearing shoes that do not have good arch support.  Doing activities that put stress on joints (high-impact activities), including  running, aerobics, and ballet.  Being overweight.  An abnormal way of walking (gait).  Tight muscles in the back of  your lower leg (calf).  High arches in your feet.  Starting a new athletic activity. What are the signs or symptoms? The main symptom of this condition is heel pain. Pain may:  Be worse with first steps after a time of rest, especially in the morning after sleeping or after you have been sitting or lying down for a while.  Be worse after long periods of standing still.  Decrease after 30-45 minutes of activity, such as gentle walking. How is this diagnosed? This condition may be diagnosed based on your medical history and your symptoms. Your health care provider may ask questions about your activity level. Your health care provider will do a physical exam to check for:  A tender area on the bottom of your foot.  A high arch in your foot.  Pain when you move your foot.  Difficulty moving your foot. You may have imaging tests to confirm the diagnosis, such as:  X-rays.  Ultrasound.  MRI. How is this treated? Treatment for plantar fasciitis depends on how severe your condition is. Treatment may include:  Rest, ice, applying pressure (compression), and raising the affected foot (elevation). This may be called RICE therapy. Your health care provider may recommend RICE therapy along with over-the-counter pain medicines to manage your pain.  Exercises to stretch your calves and your plantar fascia.  A splint that holds your foot in a stretched, upward position while you sleep (night splint).  Physical therapy to relieve symptoms and prevent problems in the future.  Injections of steroid medicine (cortisone) to relieve pain and inflammation.  Stimulating your plantar fascia with electrical impulses (extracorporeal shock wave therapy). This is usually the last treatment option before surgery.  Surgery, if other treatments have not worked after 12  months. Follow these instructions at home:  Managing pain, stiffness, and swelling  If directed, put ice on the painful area: ? Put ice in a plastic bag, or use a frozen bottle of water. ? Place a towel between your skin and the bag or bottle. ? Roll the bottom of your foot over the bag or bottle. ? Do this for 20 minutes, 2-3 times a day.  Wear athletic shoes that have air-sole or gel-sole cushions, or try wearing soft shoe inserts that are designed for plantar fasciitis.  Raise (elevate) your foot above the level of your heart while you are sitting or lying down. Activity  Avoid activities that cause pain. Ask your health care provider what activities are safe for you.  Do physical therapy exercises and stretches as told by your health care provider.  Try activities and forms of exercise that are easier on your joints (low-impact). Examples include swimming, water aerobics, and biking. General instructions  Take over-the-counter and prescription medicines only as told by your health care provider.  Wear a night splint while sleeping, if told by your health care provider. Loosen the splint if your toes tingle, become numb, or turn cold and blue.  Maintain a healthy weight, or work with your health care provider to lose weight as needed.  Keep all follow-up visits as told by your health care provider. This is important. Contact a health care provider if you:  Have symptoms that do not go away after caring for yourself at home.  Have pain that gets worse.  Have pain that affects your ability to move or do your daily activities. Summary  Plantar fasciitis is a painful foot condition that affects the heel. It occurs when the band  of tissue that connects the toes to the heel bone (plantar fascia) becomes irritated.  The main symptom of this condition is heel pain that may be worse after exercising too much or standing still for a long time.  Treatment varies, but it usually  starts with rest, ice, compression, and elevation (RICE therapy) and over-the-counter medicines to manage pain. This information is not intended to replace advice given to you by your health care provider. Make sure you discuss any questions you have with your health care provider. Document Revised: 03/15/2017 Document Reviewed: 01/28/2017 Elsevier Patient Education  2020 [employer].

## 2020-01-26 NOTE — Assessment & Plan Note (Signed)
Right sided low back pain with radiculopathy.  Discussed causes of radiculopathy, that prednisone can help with reduction of inflammation, but once completes prednisone, the radicular symptoms may return.  Discussed if they do return, would likely need to follow with Orthopedics, where they can order an MRI to look for nerve compression/disc concerns.  Provided with back exercises.  Will send in prescription for prednisone 50mg  daily x 5 days and cyclobenzaprine 5-10mg  TID PRN for muscle cramp/spasm.  Plan: 1. To begin prednisone 50mg  tablet, daily x 5 days 2. To take cyclobenzaprine 5-10mg  TID PRN for muscle cramp/spasm 3. To avoid all NSAIDs while taking prednisone, as can cause stomach upset 4. Work on back exercises/stretches while on these medications 5. RTC PRN, if would prefer, can proceed to Orthopedics for evaluation as discussed

## 2020-01-27 ENCOUNTER — Encounter: Payer: Self-pay | Admitting: Family Medicine

## 2020-01-28 ENCOUNTER — Encounter: Payer: Self-pay | Admitting: Family Medicine

## 2020-01-29 ENCOUNTER — Encounter: Payer: Self-pay | Admitting: Family Medicine

## 2020-02-01 DIAGNOSIS — M5416 Radiculopathy, lumbar region: Secondary | ICD-10-CM | POA: Diagnosis not present

## 2020-02-01 DIAGNOSIS — S39012A Strain of muscle, fascia and tendon of lower back, initial encounter: Secondary | ICD-10-CM | POA: Diagnosis not present

## 2020-02-09 ENCOUNTER — Other Ambulatory Visit: Payer: Self-pay | Admitting: Family Medicine

## 2020-02-09 ENCOUNTER — Encounter: Payer: Self-pay | Admitting: Family Medicine

## 2020-02-09 DIAGNOSIS — M5416 Radiculopathy, lumbar region: Secondary | ICD-10-CM | POA: Diagnosis not present

## 2020-02-12 ENCOUNTER — Telehealth (INDEPENDENT_AMBULATORY_CARE_PROVIDER_SITE_OTHER): Payer: BC Managed Care – PPO | Admitting: Family Medicine

## 2020-02-12 ENCOUNTER — Encounter: Payer: Self-pay | Admitting: Family Medicine

## 2020-02-12 ENCOUNTER — Other Ambulatory Visit: Payer: Self-pay

## 2020-02-12 VITALS — Temp 98.7°F

## 2020-02-12 DIAGNOSIS — M5416 Radiculopathy, lumbar region: Secondary | ICD-10-CM

## 2020-02-12 DIAGNOSIS — M5441 Lumbago with sciatica, right side: Secondary | ICD-10-CM | POA: Diagnosis not present

## 2020-02-12 NOTE — Progress Notes (Signed)
Virtual Visit via Telephone  The purpose of this virtual visit is to provide medical care while limiting exposure to the novel coronavirus (COVID19) for both patient and office staff.  Consent was obtained for phone visit:  Yes.   Answered questions that patient had about telehealth interaction:  Yes.   I discussed the limitations, risks, security and privacy concerns of performing an evaluation and management service by telephone. I also discussed with the patient that there may be a patient responsible charge related to this service. The patient expressed understanding and agreed to proceed.  Patient is at home and is accessed via telephone Services are provided by [name], FNP-C from [facility])  ---------------------------------------------------------------------- Chief Complaint  Patient presents with  . Back Pain    fmla paperwork  . Back Pain    S: Reviewed CMA documentation. I have called patient and gathered additional HPI as follows:  Mr. Failla presents for telephonic virtual visit for completion of his FMLA paperwork.  Reports he has established with [facility] in [place] for his acute right sided low back and right sided lumbar radiculopathy.  They have him in physical therapy a few times a week for the next 5-6 weeks before his insurance will approve an MRI.  Reports he has continued pain with difficulty with sitting, standing, laying, changing positions and has had his medications changed to robaxin from cyclobenzaprine with the new orthopedic provider.  Denies saddle anesthesia or change in bowel/bladder function.  Patient is currently home Denies any high risk travel to areas of current concern for COVID19. Denies any known or suspected exposure to person with or possibly with COVID19.  Past Medical History:  Diagnosis Date  . Bone spur    right knee  . Depression   . IBS (irritable bowel syndrome)   . Kidney stone     current stone outside kidney after surgery.   . OSA (obstructive sleep apnea)    no CPAP- could not get "adjusted" correctly  . Vertigo    Social History   Tobacco Use  . Smoking status: Never Smoker  . Smokeless tobacco: Never Used  Vaping Use  . Vaping Use: Never used  Substance Use Topics  . Alcohol use: Yes    Alcohol/week: 0.0 standard drinks  . Drug use: Yes    Types: Marijuana    Current Outpatient Medications:  .  ketorolac (TORADOL) 10 MG tablet, Take 10 mg by mouth every 8 (eight) hours., Disp: , Rfl:  .  methocarbamol (ROBAXIN) 750 MG tablet, Take 750 mg by mouth every 8 (eight) hours., Disp: , Rfl:  .  busPIRone (BUSPAR) 10 MG tablet, Take 1 tablet (10 mg total) by mouth 2 (two) times daily. (Patient not taking: Reported on 02/12/2020), Disp: 180 tablet, Rfl: 1 .  escitalopram (LEXAPRO) 10 MG tablet, Take 1 tablet (10 mg total) by mouth daily. (Patient not taking: Reported on 01/26/2020), Disp: 90 tablet, Rfl: 1  Depression screen [facility] 2/9 05/06/2018 05/06/2018 03/18/2017  Decreased Interest 1 1 1   Down, Depressed, Hopeless 0 0 1  PHQ - 2 Score 1 1 2   Altered sleeping - - 0  Tired, decreased energy - - 1  Change in appetite - - 0  Feeling bad or failure about yourself  - - 1  Trouble concentrating - - 0  Moving slowly or fidgety/restless - - 0  Suicidal thoughts - - -  PHQ-9 Score - - 4  Difficult doing work/chores - -  Not difficult at all  Some recent data might be hidden    GAD 7 : Generalized Anxiety Score 01/27/2018 03/18/2017 03/18/2017 01/14/2017  Nervous, Anxious, on Edge 1 1 1 2   Control/stop worrying 0 1 1 2   Worry too much - different things 0 1 1 3   Trouble relaxing 0 0 0 1  Restless 0 0 0 1  Easily annoyed or irritable 0 1 1 1   Afraid - awful might happen 0 0 0 0  Total GAD 7 Score 1 4 4 10   Anxiety Difficulty Not difficult at all Not difficult at all Not difficult at all Somewhat difficult     -------------------------------------------------------------------------- O: No physical exam performed due to remote telephone encounter.  Physical Exam: Patient remotely monitored without video.  Verbal communication appropriate.  Cognition normal.  Recent Results (from the past 2160 hour(s))  POCT Urinalysis Dipstick     Status: Abnormal   Collection Time: 01/26/20 11:20 AM  Result Value Ref Range   Color, UA yellow    Clarity, UA clear    Glucose, UA Negative Negative   Bilirubin, UA negative    Ketones, UA negative    Spec Grav, UA <=1.005 (A) 1.010 - 1.025   Blood, UA negative    pH, UA 5.0 5.0 - 8.0   Protein, UA Negative Negative   Urobilinogen, UA 0.2 0.2 or 1.0 E.U./dL   Nitrite, UA negative    Leukocytes, UA Negative Negative   Appearance     Odor      -------------------------------------------------------------------------- A&P:  Problem List Items Addressed This Visit      Nervous and Auditory   Acute right-sided low back pain with right-sided sciatica - Primary    Following with [facility] in [place], [place].  Is currently participating in physical therapy and reports needs to complete 5-6 weeks before he can have an MRI for further evaluation.  Continued difficulty with sitting, standing, laying, changing positions without increased discomfort/pain.  Will continue out of work until next visit in 4 weeks.  Continue treatment plan as written by Orthopedics.      Relevant Medications   methocarbamol (ROBAXIN) 750 MG tablet   ketorolac (TORADOL) 10 MG tablet   Right lumbar radiculopathy    See acute right sided low back pain with right sided sciatica AP      Relevant Medications   methocarbamol (ROBAXIN) 750 MG tablet      No orders of the defined types were placed in this encounter.   Follow-up: - Return in 4 weeks for re-evaluation  Patient verbalizes understanding with the above medical recommendations including the limitation of  remote medical advice.  Specific follow-up and call-back criteria were given for patient to follow-up or seek medical care more urgently if needed.  - Time spent in direct consultation with patient on phone: 7 minutes  [name], [facility] 02/12/2020, 2:03 PM

## 2020-02-16 NOTE — Assessment & Plan Note (Signed)
Following with [facility] in [place], [place].  Is currently participating in physical therapy and reports needs to complete 5-6 weeks before he can have an MRI for further evaluation.  Continued difficulty with sitting, standing, laying, changing positions without increased discomfort/pain.  Will continue out of work until next visit in 4 weeks.  Continue treatment plan as written by Orthopedics.

## 2020-02-16 NOTE — Assessment & Plan Note (Signed)
See acute right sided low back pain with right sided sciatica AP

## 2020-02-22 DIAGNOSIS — M5416 Radiculopathy, lumbar region: Secondary | ICD-10-CM | POA: Diagnosis not present

## 2020-02-29 DIAGNOSIS — M5416 Radiculopathy, lumbar region: Secondary | ICD-10-CM | POA: Diagnosis not present

## 2020-03-04 DIAGNOSIS — M5416 Radiculopathy, lumbar region: Secondary | ICD-10-CM | POA: Diagnosis not present

## 2020-03-07 DIAGNOSIS — M5416 Radiculopathy, lumbar region: Secondary | ICD-10-CM | POA: Diagnosis not present

## 2020-03-09 DIAGNOSIS — M5416 Radiculopathy, lumbar region: Secondary | ICD-10-CM | POA: Diagnosis not present

## 2020-03-14 DIAGNOSIS — S39012A Strain of muscle, fascia and tendon of lower back, initial encounter: Secondary | ICD-10-CM | POA: Diagnosis not present

## 2020-03-14 DIAGNOSIS — M5416 Radiculopathy, lumbar region: Secondary | ICD-10-CM | POA: Diagnosis not present

## 2020-03-16 ENCOUNTER — Ambulatory Visit (INDEPENDENT_AMBULATORY_CARE_PROVIDER_SITE_OTHER): Payer: BC Managed Care – PPO | Admitting: Family Medicine

## 2020-03-16 ENCOUNTER — Other Ambulatory Visit: Payer: Self-pay

## 2020-03-16 ENCOUNTER — Encounter: Payer: Self-pay | Admitting: Family Medicine

## 2020-03-16 VITALS — BP 139/75 | HR 98 | Resp 18 | Ht 71.0 in | Wt 241.4 lb

## 2020-03-16 DIAGNOSIS — M5416 Radiculopathy, lumbar region: Secondary | ICD-10-CM | POA: Diagnosis not present

## 2020-03-16 DIAGNOSIS — M5441 Lumbago with sciatica, right side: Secondary | ICD-10-CM | POA: Diagnosis not present

## 2020-03-16 NOTE — Assessment & Plan Note (Signed)
Continuing follow up with [facility], is here today to check in for an update so we can continue his FMLA paperwork once received.  To continue f/u with [facility].

## 2020-03-16 NOTE — Patient Instructions (Signed)
Keep your upcoming appointments with Orthopedics.  We will plan to see you back in 5 weeks for updated FMLA paperwork  You will receive a survey after today's visit either digitally by e-mail or paper by [place] mail. Your experiences and feedback matter to [place].  Please respond so we know how we are doing as we provide care for you.  Call us with any questions/concerns/needs.  It is my goal to be available to you for your health concerns.  Thanks for choosing me to be a partner in your healthcare needs!  [name], FNP-C Family Nurse Practitioner [facility] Phone: [phone]

## 2020-03-16 NOTE — Assessment & Plan Note (Signed)
See acute right sided low back pain with right sided sciatica AP

## 2020-03-16 NOTE — Progress Notes (Signed)
Subjective:    Patient ID: Jacob Andrews, male    DOB: 1985/05/19, 34 y.o.   MRN: [id number]  Jacob Andrews is a 34 y.o. male presenting on 03/16/2020 for Back Pain (constant Right sided low back pain w/ right sided sciatica. Pt was seen at [facility] on Monday, pending scheduling for a MRI. Pt state no improvement with PT.  )   HPI  [name] presents to clinic for a follow up on his right sided low back pain with right sided sciatica.  Reports he continues to follow with [facility], has completed physical therapy without improvement in symptoms.  Reports that he is having an MRI scheduled, that he will follow up with Orthopedics once it is completed, that the provider is encouraging ESI before looking into surgery.  Denies any improvement.  Depression screen [facility] 2/9 05/06/2018 05/06/2018 03/18/2017  Decreased Interest 1 1 1   Down, Depressed, Hopeless 0 0 1  PHQ - 2 Score 1 1 2   Altered sleeping - - 0  Tired, decreased energy - - 1  Change in appetite - - 0  Feeling bad or failure about yourself  - - 1  Trouble concentrating - - 0  Moving slowly or fidgety/restless - - 0  Suicidal thoughts - - -  PHQ-9 Score - - 4  Difficult doing work/chores - - Not difficult at all  Some recent data might be hidden    Social History   Tobacco Use  . Smoking status: Never Smoker  . Smokeless tobacco: Never Used  Vaping Use  . Vaping Use: Never used  Substance Use Topics  . Alcohol use: Yes    Alcohol/week: 0.0 standard drinks  . Drug use: Not Currently    Types: Marijuana    Review of Systems  Constitutional: Negative.   HENT: Negative.   Eyes: Negative.   Respiratory: Negative.   Cardiovascular: Negative.   Gastrointestinal: Negative.   Endocrine: Negative.   Genitourinary: Negative.   Musculoskeletal: Positive for back pain. Negative for arthralgias, gait problem, joint swelling, myalgias, neck pain and neck stiffness.  Skin: Negative.   Allergic/Immunologic: Negative.    Neurological: Positive for numbness. Negative for dizziness, tremors, seizures, syncope, facial asymmetry, speech difficulty, weakness, light-headedness and headaches.  Hematological: Negative.   Psychiatric/Behavioral: Negative.    Per HPI unless specifically indicated above     Objective:    BP 139/75 (BP Location: Right Arm, Patient Position: Sitting, Cuff Size: Large)   Pulse 98   Resp 18   Ht 5\' 11"  (1.803 m)   Wt 241 lb 6.4 oz (109.5 kg)   SpO2 100%   BMI 33.67 kg/m   Wt Readings from Last 3 Encounters:  03/16/20 241 lb 6.4 oz (109.5 kg)  01/26/20 237 lb 3.2 oz (107.6 kg)  06/26/19 225 lb (102.1 kg)    Physical Exam Vitals and nursing note reviewed.  Constitutional:      General: He is not in acute distress.    Appearance: Normal appearance. He is well-developed and well-groomed. He is obese. He is not ill-appearing or toxic-appearing.  HENT:     Head: Normocephalic and atraumatic.     Nose:     Comments: [name] is in place, covering mouth and nose. Eyes:     General:        Right eye: No discharge.        Left eye: No discharge.     Extraocular Movements: Extraocular movements intact.  Conjunctiva/sclera: Conjunctivae normal.     Pupils: Pupils are equal, round, and reactive to light.  Cardiovascular:     Pulses: Normal pulses.  Pulmonary:     Effort: Pulmonary effort is normal. No respiratory distress.  Musculoskeletal:     Cervical back: Normal.     Thoracic back: Normal.     Lumbar back: Spasms present. No swelling, deformity, lacerations or tenderness. Positive right straight leg raise test. Negative left straight leg raise test.     Comments: Deferred repeat exam, as following with speciality  Skin:    General: Skin is warm and dry.     Capillary Refill: Capillary refill takes less than 2 seconds.  Neurological:     General: No focal deficit present.     Mental Status: He is alert and oriented to person, place, and time.  Psychiatric:         Attention and Perception: Attention and perception normal.        Mood and Affect: Mood and affect normal.        Speech: Speech normal.        Behavior: Behavior normal. Behavior is cooperative.        Thought Content: Thought content normal.        Cognition and Memory: Cognition and memory normal.    Results for orders placed or performed in visit on 01/26/20  POCT Urinalysis Dipstick  Result Value Ref Range   Color, UA yellow    Clarity, UA clear    Glucose, UA Negative Negative   Bilirubin, UA negative    Ketones, UA negative    Spec Grav, UA <=1.005 (A) 1.010 - 1.025   Blood, UA negative    pH, UA 5.0 5.0 - 8.0   Protein, UA Negative Negative   Urobilinogen, UA 0.2 0.2 or 1.0 E.U./dL   Nitrite, UA negative    Leukocytes, UA Negative Negative   Appearance     Odor        Assessment & Plan:   Problem List Items Addressed This Visit      Nervous and Auditory   Acute right-sided low back pain with right-sided sciatica - Primary    Continuing follow up with [facility], is here today to check in for an update so we can continue his FMLA paperwork once received.  To continue f/u with [facility].      Right lumbar radiculopathy    See acute right sided low back pain with right sided sciatica AP         No orders of the defined types were placed in this encounter.   Follow up plan: Return in about 5 weeks (around 04/20/2020) for Back pain f/u.   [name], [place] Family Nurse Practitioner [facility] 03/16/2020, 2:58 PM

## 2020-03-22 DIAGNOSIS — S39012A Strain of muscle, fascia and tendon of lower back, initial encounter: Secondary | ICD-10-CM | POA: Diagnosis not present

## 2020-03-25 DIAGNOSIS — M5380 Other specified dorsopathies, site unspecified: Secondary | ICD-10-CM | POA: Diagnosis not present

## 2020-03-29 ENCOUNTER — Encounter: Payer: Self-pay | Admitting: Family Medicine

## 2020-04-11 DIAGNOSIS — M5416 Radiculopathy, lumbar region: Secondary | ICD-10-CM | POA: Diagnosis not present

## 2020-04-20 ENCOUNTER — Encounter: Payer: Self-pay | Admitting: Family Medicine

## 2020-04-20 ENCOUNTER — Other Ambulatory Visit: Payer: Self-pay

## 2020-04-20 ENCOUNTER — Ambulatory Visit (INDEPENDENT_AMBULATORY_CARE_PROVIDER_SITE_OTHER): Payer: BC Managed Care – PPO | Admitting: Family Medicine

## 2020-04-20 VITALS — BP 120/72 | HR 92 | Temp 97.7°F | Resp 18 | Ht 71.0 in | Wt 244.0 lb

## 2020-04-20 DIAGNOSIS — M5441 Lumbago with sciatica, right side: Secondary | ICD-10-CM | POA: Diagnosis not present

## 2020-04-20 NOTE — Progress Notes (Signed)
Subjective:    Patient ID: Jacob Andrews, male    DOB: 07-15-1985, 35 y.o.   MRN: [id number]  Jacob Andrews is a 35 y.o. male presenting on 04/20/2020 for Back Pain (The pt state he had a spinal injection on 04/11/20. No improvement after the injection. F/u schedule with [facility] on 04/29/20. )   HPI  Jacob Andrews presents to clinic for follow up on his right sided low back pain with radicular symptoms and weakness.  Has continued to follow with [facility] and had an ESI with them on 04/11/2020, without reported improvement in symptoms.  Reports that he has a follow up scheduled with them for 04/29/2020 for updated treatment plan.  Has continued weakness on right leg with positive straight leg raise test.  Has completed physical therapy with them.  Denies any worsening of symptoms, but has not had improvement either.  Depression screen [facility] 2/9 05/06/2018 05/06/2018 03/18/2017  Decreased Interest 1 1 1   Down, Depressed, Hopeless 0 0 1  PHQ - 2 Score 1 1 2   Altered sleeping - - 0  Tired, decreased energy - - 1  Change in appetite - - 0  Feeling bad or failure about yourself  - - 1  Trouble concentrating - - 0  Moving slowly or fidgety/restless - - 0  Suicidal thoughts - - -  PHQ-9 Score - - 4  Difficult doing work/chores - - Not difficult at all  Some recent data might be hidden    Social History   Tobacco Use  . Smoking status: Never Smoker  . Smokeless tobacco: Never Used  Vaping Use  . Vaping Use: Never used  Substance Use Topics  . Alcohol use: Yes    Alcohol/week: 0.0 standard drinks  . Drug use: Not Currently    Types: Marijuana    Review of Systems  Constitutional: Negative.   HENT: Negative.   Eyes: Negative.   Respiratory: Negative.   Cardiovascular: Negative.   Gastrointestinal: Negative.   Endocrine: Negative.   Genitourinary: Negative.   Musculoskeletal: Positive for arthralgias and back pain. Negative for gait problem, joint swelling, myalgias, neck  pain and neck stiffness.  Skin: Negative.   Allergic/Immunologic: Negative.   Neurological: Positive for weakness. Negative for dizziness, tremors, seizures, syncope, facial asymmetry, speech difficulty, light-headedness, numbness and headaches.  Hematological: Negative.   Psychiatric/Behavioral: Negative.    Per HPI unless specifically indicated above     Objective:    BP 120/72 (BP Location: Right Arm, Patient Position: Sitting, Cuff Size: Large)   Pulse 92   Temp 97.7 F (36.5 C) (Temporal)   Resp 18   Ht 5\' 11"  (1.803 m)   Wt 244 lb (110.7 kg)   SpO2 100%   BMI 34.03 kg/m   Wt Readings from Last 3 Encounters:  04/20/20 244 lb (110.7 kg)  03/16/20 241 lb 6.4 oz (109.5 kg)  01/26/20 237 lb 3.2 oz (107.6 kg)    Physical Exam Vitals and nursing note reviewed.  Constitutional:      General: He is not in acute distress.    Appearance: Normal appearance. He is well-developed and well-groomed. He is obese. He is not ill-appearing or toxic-appearing.  HENT:     Head: Normocephalic and atraumatic.     Nose:     Comments: 06/18/20 is in place, covering mouth and nose. Eyes:     General:        Right eye: No discharge.  Left eye: No discharge.     Extraocular Movements: Extraocular movements intact.     Conjunctiva/sclera: Conjunctivae normal.     Pupils: Pupils are equal, round, and reactive to light.  Cardiovascular:     Rate and Rhythm: Normal rate and regular rhythm.     Pulses: Normal pulses.     Heart sounds: Normal heart sounds. No murmur heard. No friction rub. No gallop.   Pulmonary:     Effort: Pulmonary effort is normal. No respiratory distress.     Breath sounds: Normal breath sounds.  Musculoskeletal:     Cervical back: Normal.     Thoracic back: Normal.     Lumbar back: No swelling, deformity, lacerations, spasms or tenderness. Positive right straight leg raise test. Negative left straight leg raise test.     Comments: Deferred repeat exam, as  following with speciality  Skin:    General: Skin is warm and dry.     Capillary Refill: Capillary refill takes less than 2 seconds.  Neurological:     General: No focal deficit present.     Mental Status: He is alert and oriented to person, place, and time.  Psychiatric:        Attention and Perception: Attention and perception normal.        Mood and Affect: Mood and affect normal.        Speech: Speech normal.        Behavior: Behavior normal. Behavior is cooperative.        Thought Content: Thought content normal.        Cognition and Memory: Cognition and memory normal.    Results for orders placed or performed in visit on 01/26/20  POCT Urinalysis Dipstick  Result Value Ref Range   Color, UA yellow    Clarity, UA clear    Glucose, UA Negative Negative   Bilirubin, UA negative    Ketones, UA negative    Spec Grav, UA <=1.005 (A) 1.010 - 1.025   Blood, UA negative    pH, UA 5.0 5.0 - 8.0   Protein, UA Negative Negative   Urobilinogen, UA 0.2 0.2 or 1.0 E.U./dL   Nitrite, UA negative    Leukocytes, UA Negative Negative   Appearance     Odor        Assessment & Plan:   Problem List Items Addressed This Visit      Nervous and Auditory   Acute right-sided low back pain with right-sided sciatica - Primary    Continuing follow up with [facility].  Reports is here today for an update so we can continue his FMLA paperwork once received.  Has had an ESI with Orthopedics, has stopped physical therapy, has an appointment with Orthopedics for later this month to transfer his FMLA paperwork to them and updated RTW plan.         No orders of the defined types were placed in this encounter.  Follow up plan: Return if symptoms worsen or fail to improve.   [name], [place] Family Nurse Practitioner [facility] 04/20/2020, 9:06 AM

## 2020-04-20 NOTE — Assessment & Plan Note (Signed)
Continuing follow up with [facility].  Reports is here today for an update so we can continue his FMLA paperwork once received.  Has had an ESI with Orthopedics, has stopped physical therapy, has an appointment with Orthopedics for later this month to transfer his FMLA paperwork to them and updated RTW plan.

## 2020-04-20 NOTE — Patient Instructions (Signed)
Keep upcoming appointment with [facility] for 04/29/2020 for updated treatment plan.  To discuss transfer of FMLA paperwork to their office for this condition.  You will receive a survey after today's visit either digitally by e-mail or paper by [employer]. Your experiences and feedback matter to [place].  Please respond so we know how we are doing as we provide care for you.  Call us with any questions/concerns/needs.  It is my goal to be available to you for your health concerns.  Thanks for choosing me to be a partner in your healthcare needs!  Charlaine Dalton, FNP-C Family Nurse Practitioner [facility] Phone: [phone]

## 2020-04-29 DIAGNOSIS — M5116 Intervertebral disc disorders with radiculopathy, lumbar region: Secondary | ICD-10-CM | POA: Diagnosis not present

## 2020-04-29 DIAGNOSIS — M5416 Radiculopathy, lumbar region: Secondary | ICD-10-CM | POA: Diagnosis not present

## 2020-05-03 ENCOUNTER — Encounter: Payer: Self-pay | Admitting: Family Medicine

## 2020-05-04 ENCOUNTER — Other Ambulatory Visit: Payer: Self-pay

## 2020-05-04 ENCOUNTER — Encounter: Payer: Self-pay | Admitting: Family Medicine

## 2020-05-04 ENCOUNTER — Ambulatory Visit (INDEPENDENT_AMBULATORY_CARE_PROVIDER_SITE_OTHER): Payer: BC Managed Care – PPO | Admitting: Family Medicine

## 2020-05-04 VITALS — BP 129/80 | HR 81 | Temp 97.5°F | Resp 18 | Ht 71.0 in | Wt 242.6 lb

## 2020-05-04 DIAGNOSIS — M5441 Lumbago with sciatica, right side: Secondary | ICD-10-CM

## 2020-05-04 DIAGNOSIS — M5416 Radiculopathy, lumbar region: Secondary | ICD-10-CM | POA: Diagnosis not present

## 2020-05-04 NOTE — Assessment & Plan Note (Signed)
See acute right sided LBP with right sided radiculopathy AP

## 2020-05-04 NOTE — Assessment & Plan Note (Signed)
Continuing follow up with [facility] and documentation for his FMLA paperwork.  Has upcoming ESI on 05/09/2020 for re-evaluation of treatment plan and discussion for proposed surgery if no improvement.  Will plan RTW with flexible sit/stand schedule as of 05/16/2020.

## 2020-05-04 NOTE — Progress Notes (Signed)
Subjective:    Patient ID: Jacob Andrews, male    DOB: May 26, 1985, 35 y.o.   MRN: [id number]  Jacob Andrews is a 35 y.o. male presenting on 05/04/2020 for Sciatica (Chronic sciatica pain )   HPI  Jacob Andrews presents to clinic for follow up on his FMLA paperwork.  He is currently being followed by [facility] and has his next appointment 05/09/2020 for repeat epidural steroid injection.  He reports that if this does not improve his symptoms, they have recommended surgery.  Depression screen [facility] 2/9 05/06/2018 05/06/2018 03/18/2017  Decreased Interest 1 1 1   Down, Depressed, Hopeless 0 0 1  PHQ - 2 Score 1 1 2   Altered sleeping - - 0  Tired, decreased energy - - 1  Change in appetite - - 0  Feeling bad or failure about yourself  - - 1  Trouble concentrating - - 0  Moving slowly or fidgety/restless - - 0  Suicidal thoughts - - -  PHQ-9 Score - - 4  Difficult doing work/chores - - Not difficult at all  Some recent data might be hidden    Social History   Tobacco Use  . Smoking status: Never Smoker  . Smokeless tobacco: Never Used  Vaping Use  . Vaping Use: Never used  Substance Use Topics  . Alcohol use: Yes    Alcohol/week: 0.0 standard drinks  . Drug use: Not Currently    Types: Marijuana    Review of Systems  Constitutional: Negative.   HENT: Negative.   Eyes: Negative.   Respiratory: Negative.   Cardiovascular: Negative.   Gastrointestinal: Negative.   Endocrine: Negative.   Genitourinary: Negative.   Musculoskeletal: Positive for arthralgias and back pain. Negative for gait problem, joint swelling, myalgias, neck pain and neck stiffness.  Skin: Negative.   Allergic/Immunologic: Negative.   Neurological: Positive for weakness. Negative for dizziness, tremors, seizures, syncope, facial asymmetry, speech difficulty, light-headedness, numbness and headaches.  Hematological: Negative.   Psychiatric/Behavioral: Negative.    Per HPI unless specifically  indicated above     Objective:    BP 129/80 (BP Location: Right Arm, Patient Position: Sitting, Cuff Size: Large)   Pulse 81   Temp (!) 97.5 F (36.4 C) (Temporal)   Resp 18   Ht 5\' 11"  (1.803 m)   Wt 242 lb 9.6 oz (110 kg)   SpO2 99%   BMI 33.84 kg/m   Wt Readings from Last 3 Encounters:  05/04/20 242 lb 9.6 oz (110 kg)  04/20/20 244 lb (110.7 kg)  03/16/20 241 lb 6.4 oz (109.5 kg)    Physical Exam Vitals and nursing note reviewed.  Constitutional:      General: He is not in acute distress.    Appearance: Normal appearance. He is well-developed and well-groomed. He is obese. He is not ill-appearing or toxic-appearing.  HENT:     Head: Normocephalic and atraumatic.     Nose:     Comments: [name] is in place, covering mouth and nose. Eyes:     General:        Right eye: No discharge.        Left eye: No discharge.     Extraocular Movements: Extraocular movements intact.     Conjunctiva/sclera: Conjunctivae normal.     Pupils: Pupils are equal, round, and reactive to light.  Cardiovascular:     Rate and Rhythm: Normal rate and regular rhythm.     Pulses: Normal pulses.     Heart sounds:  Normal heart sounds. No murmur heard. No friction rub. No gallop.   Pulmonary:     Effort: Pulmonary effort is normal. No respiratory distress.     Breath sounds: Normal breath sounds.  Musculoskeletal:     Cervical back: Normal.     Thoracic back: Normal.     Lumbar back: No swelling, deformity, lacerations, spasms or tenderness. Positive right straight leg raise test. Negative left straight leg raise test.     Comments: Deferred repeat exam, as following with speciality  Skin:    General: Skin is warm and dry.     Capillary Refill: Capillary refill takes less than 2 seconds.  Neurological:     General: No focal deficit present.     Mental Status: He is alert and oriented to person, place, and time.  Psychiatric:        Attention and Perception: Attention and perception normal.         Mood and Affect: Mood and affect normal.        Speech: Speech normal.        Behavior: Behavior normal. Behavior is cooperative.        Thought Content: Thought content normal.        Cognition and Memory: Cognition and memory normal.    Results for orders placed or performed in visit on 01/26/20  POCT Urinalysis Dipstick  Result Value Ref Range   Color, UA yellow    Clarity, UA clear    Glucose, UA Negative Negative   Bilirubin, UA negative    Ketones, UA negative    Spec Grav, UA <=1.005 (A) 1.010 - 1.025   Blood, UA negative    pH, UA 5.0 5.0 - 8.0   Protein, UA Negative Negative   Urobilinogen, UA 0.2 0.2 or 1.0 E.U./dL   Nitrite, UA negative    Leukocytes, UA Negative Negative   Appearance     Odor        Assessment & Plan:   Problem List Items Addressed This Visit      Nervous and Auditory   Acute right-sided low back pain with right-sided sciatica - Primary    Continuing follow up with [facility] and documentation for his FMLA paperwork.  Has upcoming ESI on 05/09/2020 for re-evaluation of treatment plan and discussion for proposed surgery if no improvement.  Will plan RTW with flexible sit/stand schedule as of 05/16/2020.      Right lumbar radiculopathy    See acute right sided LBP with right sided radiculopathy AP         No orders of the defined types were placed in this encounter.   Follow up plan: Return if symptoms worsen or fail to improve.   [name], [place] Family Nurse Practitioner [facility] 05/04/2020, 10:11 AM

## 2020-05-09 DIAGNOSIS — M5416 Radiculopathy, lumbar region: Secondary | ICD-10-CM | POA: Diagnosis not present

## 2020-05-27 DIAGNOSIS — M5380 Other specified dorsopathies, site unspecified: Secondary | ICD-10-CM | POA: Diagnosis not present

## 2020-06-24 DIAGNOSIS — Z01818 Encounter for other preprocedural examination: Secondary | ICD-10-CM | POA: Diagnosis not present

## 2020-06-28 DIAGNOSIS — M5386 Other specified dorsopathies, lumbar region: Secondary | ICD-10-CM | POA: Diagnosis not present

## 2020-06-28 DIAGNOSIS — Z87442 Personal history of urinary calculi: Secondary | ICD-10-CM | POA: Diagnosis not present

## 2020-06-28 DIAGNOSIS — M5117 Intervertebral disc disorders with radiculopathy, lumbosacral region: Secondary | ICD-10-CM | POA: Diagnosis not present

## 2020-06-28 DIAGNOSIS — M5116 Intervertebral disc disorders with radiculopathy, lumbar region: Secondary | ICD-10-CM | POA: Diagnosis not present

## 2020-06-28 DIAGNOSIS — Z885 Allergy status to narcotic agent status: Secondary | ICD-10-CM | POA: Diagnosis not present

## 2020-08-24 DIAGNOSIS — M5416 Radiculopathy, lumbar region: Secondary | ICD-10-CM | POA: Diagnosis not present

## 2020-09-21 DIAGNOSIS — M5416 Radiculopathy, lumbar region: Secondary | ICD-10-CM | POA: Diagnosis not present

## 2020-10-21 DIAGNOSIS — M5416 Radiculopathy, lumbar region: Secondary | ICD-10-CM | POA: Diagnosis not present

## 2021-05-16 DIAGNOSIS — M5116 Intervertebral disc disorders with radiculopathy, lumbar region: Secondary | ICD-10-CM | POA: Diagnosis not present

## 2021-06-08 DIAGNOSIS — L700 Acne vulgaris: Secondary | ICD-10-CM | POA: Diagnosis not present

## 2021-06-08 DIAGNOSIS — Z792 Long term (current) use of antibiotics: Secondary | ICD-10-CM | POA: Diagnosis not present

## 2021-06-19 DIAGNOSIS — M5116 Intervertebral disc disorders with radiculopathy, lumbar region: Secondary | ICD-10-CM | POA: Diagnosis not present

## 2021-07-05 DIAGNOSIS — M545 Low back pain, unspecified: Secondary | ICD-10-CM | POA: Diagnosis not present

## 2021-07-05 DIAGNOSIS — M5416 Radiculopathy, lumbar region: Secondary | ICD-10-CM | POA: Diagnosis not present

## 2021-07-05 DIAGNOSIS — M5116 Intervertebral disc disorders with radiculopathy, lumbar region: Secondary | ICD-10-CM | POA: Diagnosis not present

## 2021-07-20 DIAGNOSIS — Z792 Long term (current) use of antibiotics: Secondary | ICD-10-CM | POA: Diagnosis not present

## 2021-07-20 DIAGNOSIS — L700 Acne vulgaris: Secondary | ICD-10-CM | POA: Diagnosis not present

## 2021-09-28 DIAGNOSIS — R69 Illness, unspecified: Secondary | ICD-10-CM | POA: Diagnosis not present

## 2021-09-28 DIAGNOSIS — F411 Generalized anxiety disorder: Secondary | ICD-10-CM | POA: Diagnosis not present

## 2021-10-05 DIAGNOSIS — F411 Generalized anxiety disorder: Secondary | ICD-10-CM | POA: Diagnosis not present

## 2021-10-05 DIAGNOSIS — R69 Illness, unspecified: Secondary | ICD-10-CM | POA: Diagnosis not present

## 2021-10-11 DIAGNOSIS — R69 Illness, unspecified: Secondary | ICD-10-CM | POA: Diagnosis not present

## 2021-10-11 DIAGNOSIS — F411 Generalized anxiety disorder: Secondary | ICD-10-CM | POA: Diagnosis not present

## 2021-10-30 DIAGNOSIS — F411 Generalized anxiety disorder: Secondary | ICD-10-CM | POA: Diagnosis not present

## 2021-10-30 DIAGNOSIS — R69 Illness, unspecified: Secondary | ICD-10-CM | POA: Diagnosis not present

## 2021-11-15 ENCOUNTER — Ambulatory Visit: Payer: BC Managed Care – PPO | Admitting: Nurse Practitioner

## 2021-12-20 DIAGNOSIS — Z23 Encounter for immunization: Secondary | ICD-10-CM | POA: Diagnosis not present

## 2021-12-29 ENCOUNTER — Ambulatory Visit (INDEPENDENT_AMBULATORY_CARE_PROVIDER_SITE_OTHER): Payer: 59 | Admitting: Internal Medicine

## 2021-12-29 ENCOUNTER — Encounter: Payer: Self-pay | Admitting: Internal Medicine

## 2021-12-29 VITALS — BP 136/84 | HR 90 | Temp 97.1°F | Ht 70.0 in | Wt 244.0 lb

## 2021-12-29 DIAGNOSIS — E7800 Pure hypercholesterolemia, unspecified: Secondary | ICD-10-CM | POA: Insufficient documentation

## 2021-12-29 DIAGNOSIS — Z114 Encounter for screening for human immunodeficiency virus [HIV]: Secondary | ICD-10-CM

## 2021-12-29 DIAGNOSIS — F419 Anxiety disorder, unspecified: Secondary | ICD-10-CM | POA: Insufficient documentation

## 2021-12-29 DIAGNOSIS — F32A Depression, unspecified: Secondary | ICD-10-CM

## 2021-12-29 DIAGNOSIS — R7309 Other abnormal glucose: Secondary | ICD-10-CM

## 2021-12-29 DIAGNOSIS — G4730 Sleep apnea, unspecified: Secondary | ICD-10-CM | POA: Diagnosis not present

## 2021-12-29 DIAGNOSIS — Z1159 Encounter for screening for other viral diseases: Secondary | ICD-10-CM

## 2021-12-29 DIAGNOSIS — R82998 Other abnormal findings in urine: Secondary | ICD-10-CM | POA: Diagnosis not present

## 2021-12-29 DIAGNOSIS — R350 Frequency of micturition: Secondary | ICD-10-CM | POA: Diagnosis not present

## 2021-12-29 DIAGNOSIS — K580 Irritable bowel syndrome with diarrhea: Secondary | ICD-10-CM | POA: Diagnosis not present

## 2021-12-29 DIAGNOSIS — R69 Illness, unspecified: Secondary | ICD-10-CM | POA: Diagnosis not present

## 2021-12-29 DIAGNOSIS — M5441 Lumbago with sciatica, right side: Secondary | ICD-10-CM

## 2021-12-29 DIAGNOSIS — G8929 Other chronic pain: Secondary | ICD-10-CM | POA: Insufficient documentation

## 2021-12-29 LAB — POCT URINALYSIS DIPSTICK
Bilirubin, UA: NEGATIVE
Blood, UA: NEGATIVE
Glucose, UA: POSITIVE — AB
Ketones, UA: NEGATIVE
Leukocytes, UA: NEGATIVE
Nitrite, UA: NEGATIVE
Protein, UA: NEGATIVE
Spec Grav, UA: 1.02 (ref 1.010–1.025)
Urobilinogen, UA: NEGATIVE E.U./dL — AB
pH, UA: 6 (ref 5.0–8.0)

## 2021-12-29 NOTE — Patient Instructions (Signed)

## 2021-12-29 NOTE — Progress Notes (Signed)
Subjective:    Patient ID: Jacob Andrews, male    DOB: 12-20-1985, 36 y.o.   MRN: [id number]  HPI  Patient presents to clinic today for follow-up of chronic conditions.  OSA: He averages hours of sleep per night without the use of CPAP.  Sleep study from 02/2015 reviewed.  IBS: Mainly diarrhea.  He takes Imodium as needed with some relief of symptoms.  Colonoscopy from 12/2016 reviewed.  Anxiety and Depression: He is not currently taking any medication for this.  He is not currently seeing a therapist.  He denies SI/HI.  HLD: His last LDL was 117, triglycerides 150, 09/2016.  He is not taking any cholesterol-lowering medication at this time.  He does not consume a low-fat diet.  Chronic Low Back Pain with Right-Sided Sciatica: Status post surgical intervention in 2022.  He reports persistent pain and tingling in his right lower extremity.  He denies weakness.  Review of Systems     Past Medical History:  Diagnosis Date   Bone spur    right knee   Depression    IBS (irritable bowel syndrome)    Kidney stone    current stone outside kidney after surgery.    OSA (obstructive sleep apnea)    no CPAP- could not get "adjusted" correctly   Vertigo     No current outpatient medications on file.   No current facility-administered medications for this visit.    Allergies  Allergen Reactions   Cinnamon Swelling    Lip and mouth swellig   Oxycodone Hcl     Other reaction(s): Headache   Oxycodone Other (See Comments)    Other reaction(s): Headache, migraine Other reaction(s): Headache    Family History  Problem Relation Age of Onset   Diabetes Father    Hypertension Mother    Heart disease Maternal Grandmother        heart attack   Heart disease Paternal Grandfather    Stroke Paternal Grandfather     Social History   Socioeconomic History   Marital status: Single    Spouse name: Not on file   Number of children: Not on file   Years of education: Not on file    Highest education level: Not on file  Occupational History   Not on file  Tobacco Use   Smoking status: Never   Smokeless tobacco: Never  Vaping Use   Vaping Use: Never used  Substance and Sexual Activity   Alcohol use: Yes    Alcohol/week: 0.0 standard drinks of alcohol   Drug use: Not Currently    Types: Marijuana   Sexual activity: Yes    Partners: Female    Comment: partner is on the pill.   Other Topics Concern   Not on file  Social History Narrative   Not on file   Social Determinants of Health   Financial Resource Strain: Not on file  Food Insecurity: Not on file  Transportation Needs: Not on file  Physical Activity: Not on file  Stress: Not on file  Social Connections: Not on file  Intimate Partner Violence: Not on file     Constitutional: Denies fever, malaise, fatigue, headache or abrupt weight changes.  HEENT: Denies eye pain, eye redness, ear pain, ringing in the ears, wax buildup, runny nose, nasal congestion, bloody nose, or sore throat. Respiratory: Denies difficulty breathing, shortness of breath, cough or sputum production.   Cardiovascular: Denies chest pain, chest tightness, palpitations or swelling in the hands or feet.  Gastrointestinal: Patient reports intermittent diarrhea.  Denies abdominal pain, bloating, constipation, or blood in the stool.  GU: Pt reports foamy urine and urinary frequency. Denies pain with urination, burning sensation, blood in urine, odor or discharge. Musculoskeletal: Pt reports muscle spasms in back, chronic low back pain. Denies decrease in range of motion, difficulty with gait, or joint swelling.  Skin: Denies redness, rashes, lesions or ulcercations.  Neurological: Pt reports tingling in RLE. Denies dizziness, difficulty with memory, difficulty with speech or problems with balance and coordination.  Psych: Patient has a history of anxiety and depression.  Denies SI/HI.  No other specific complaints in a complete review of  systems (except as listed in HPI above).  Objective:   Physical Exam BP 136/84 (BP Location: Right Arm, Patient Position: Sitting, Cuff Size: Large)   Pulse 90   Temp (!) 97.1 F (36.2 C) (Temporal)   Ht '5\' 10"'$  (1.778 m)   Wt 244 lb (110.7 kg)   SpO2 98%   BMI 35.01 kg/m   Wt Readings from Last 3 Encounters:  05/04/20 242 lb 9.6 oz (110 kg)  04/20/20 244 lb (110.7 kg)  03/16/20 241 lb 6.4 oz (109.5 kg)    General: Appears his stated age, obese, in NAD. Skin: Warm, dry and intact.  HEENT: Head: normal shape and size; Eyes: sclera white, no icterus, conjunctiva pink, PERRLA and EOMs intact;  Cardiovascular: Normal rate and rhythm. S1,S2 noted.  No murmur, rubs or gallops noted. No JVD or BLE edema.  Pulmonary/Chest: Normal effort and positive vesicular breath sounds. No respiratory distress. No wheezes, rales or ronchi noted.  Abdomen: Normal bowel sounds.  No CVA tenderness noted. Musculoskeletal: No bony tenderness noted over the lumbar spine.  Strength 5/5 BLE.  No difficulty with gait.  Neurological: Alert and oriented. Cranial nerves II-XII grossly intact. Coordination normal.  Psychiatric: Mood and affect normal. Behavior is normal. Judgment and thought content normal.    BMET    Component Value Date/Time   NA 136 05/14/2018 1906   NA 136 03/23/2012 1414   K 4.1 05/14/2018 1906   K 3.8 03/23/2012 1414   CL 102 05/14/2018 1906   CL 101 03/23/2012 1414   CO2 25 05/14/2018 1906   CO2 27 03/23/2012 1414   GLUCOSE 89 05/14/2018 1906   GLUCOSE 92 03/23/2012 1414   BUN 15 05/14/2018 1906   BUN 9 03/23/2012 1414   CREATININE 0.81 05/14/2018 1906   CREATININE 0.86 12/05/2015 0916   CALCIUM 9.5 05/14/2018 1906   CALCIUM 8.9 03/23/2012 1414   GFRNONAA >60 05/14/2018 1906   GFRNONAA >89 12/05/2015 0916   GFRAA >60 05/14/2018 1906   GFRAA >89 12/05/2015 0916    Lipid Panel     Component Value Date/Time   CHOL 178 10/09/2016 0938   TRIG 150.0 (H) 10/09/2016 0938    HDL 31.20 (L) 10/09/2016 0938   CHOLHDL 6 10/09/2016 0938   VLDL 30.0 10/09/2016 0938   LDLCALC 117 (H) 10/09/2016 0938    CBC    Component Value Date/Time   WBC 10.0 05/14/2018 1906   RBC 5.04 05/14/2018 1906   HGB 16.2 05/14/2018 1906   HGB 15.2 03/23/2012 1414   HCT 45.9 05/14/2018 1906   HCT 43.7 03/23/2012 1414   PLT 263 05/14/2018 1906   PLT 248 03/23/2012 1414   MCV 91.1 05/14/2018 1906   MCV 89 03/23/2012 1414   MCH 32.1 05/14/2018 1906   MCHC 35.3 05/14/2018 1906   RDW 12.1 05/14/2018 1906  RDW 12.7 03/23/2012 1414   LYMPHSABS 3.1 05/14/2018 1906   LYMPHSABS 1.8 03/23/2012 1414   MONOABS 0.7 05/14/2018 1906   MONOABS 0.7 03/23/2012 1414   EOSABS 0.2 05/14/2018 1906   EOSABS 0.1 03/23/2012 1414   BASOSABS 0.1 05/14/2018 1906   BASOSABS 0.1 03/23/2012 1414    Hgb A1C No results found for: "HGBA1C"         Assessment & Plan:   Foamy Urine, Urinary Frequency:  Urinalysis: 1000+ glucose Will check A1C  RTC in 6 months for your annual exam Webb Silversmith, NP

## 2021-12-29 NOTE — Assessment & Plan Note (Signed)
C-Met and lipid profile today Encourage him to consume a low-fat diet

## 2021-12-29 NOTE — Assessment & Plan Note (Signed)
Currently not an issue off meds Support offered

## 2021-12-29 NOTE — Assessment & Plan Note (Signed)
He is having muscle spasms Encourage daily stretching and core strengthening Encourage weight loss as this can help reduce back pain

## 2021-12-29 NOTE — Assessment & Plan Note (Signed)
Managed with diet Okay to take Imodium OTC as needed

## 2021-12-29 NOTE — Assessment & Plan Note (Signed)
Referral to pulmonology for repeat sleep study as he would like a CPAP machine Encourage weight loss as this can help reduce sleep apnea symptoms

## 2022-01-02 LAB — CBC
HCT: 46.6 % (ref 38.5–50.0)
Hemoglobin: 15.9 g/dL (ref 13.2–17.1)
MCH: 31.3 pg (ref 27.0–33.0)
MCHC: 34.1 g/dL (ref 32.0–36.0)
MCV: 91.7 fL (ref 80.0–100.0)
MPV: 9.8 fL (ref 7.5–12.5)
Platelets: 272 10*3/uL (ref 140–400)
RBC: 5.08 10*6/uL (ref 4.20–5.80)
RDW: 12.2 % (ref 11.0–15.0)
WBC: 6.4 10*3/uL (ref 3.8–10.8)

## 2022-01-02 LAB — COMPLETE METABOLIC PANEL WITH GFR
AG Ratio: 1.9 (calc) (ref 1.0–2.5)
ALT: 42 U/L (ref 9–46)
AST: 24 U/L (ref 10–40)
Albumin: 4.6 g/dL (ref 3.6–5.1)
Alkaline phosphatase (APISO): 65 U/L (ref 36–130)
BUN: 15 mg/dL (ref 7–25)
CO2: 25 mmol/L (ref 20–32)
Calcium: 9.7 mg/dL (ref 8.6–10.3)
Chloride: 102 mmol/L (ref 98–110)
Creat: 0.67 mg/dL (ref 0.60–1.26)
Globulin: 2.4 g/dL (calc) (ref 1.9–3.7)
Glucose, Bld: 224 mg/dL — ABNORMAL HIGH (ref 65–99)
Potassium: 3.9 mmol/L (ref 3.5–5.3)
Sodium: 137 mmol/L (ref 135–146)
Total Bilirubin: 0.3 mg/dL (ref 0.2–1.2)
Total Protein: 7 g/dL (ref 6.1–8.1)
eGFR: 126 mL/min/{1.73_m2} (ref >=60)

## 2022-01-02 LAB — LIPID PANEL
Cholesterol: 225 mg/dL — ABNORMAL HIGH (ref <200)
HDL: 31 mg/dL — ABNORMAL LOW (ref >=40)
Non-HDL Cholesterol (Calc): 194 mg/dL (calc) — ABNORMAL HIGH (ref <130)
Total CHOL/HDL Ratio: 7.3 (calc) — ABNORMAL HIGH (ref <5.0)
Triglycerides: 423 mg/dL — ABNORMAL HIGH (ref <150)

## 2022-01-02 LAB — HIV ANTIBODY (ROUTINE TESTING W REFLEX): HIV 1&2 Ab, 4th Generation: NONREACTIVE

## 2022-01-02 LAB — HEMOGLOBIN A1C
Hgb A1c MFr Bld: 9.5 % of total Hgb — ABNORMAL HIGH (ref <5.7)
Mean Plasma Glucose: 226 mg/dL
eAG (mmol/L): 12.5 mmol/L

## 2022-01-02 LAB — HEPATITIS C ANTIBODY: Hepatitis C Ab: NONREACTIVE

## 2022-01-05 ENCOUNTER — Encounter: Payer: Self-pay | Admitting: Internal Medicine

## 2022-01-05 ENCOUNTER — Ambulatory Visit (INDEPENDENT_AMBULATORY_CARE_PROVIDER_SITE_OTHER): Payer: 59 | Admitting: Internal Medicine

## 2022-01-05 VITALS — BP 132/72 | HR 97 | Temp 96.9°F | Wt 244.0 lb

## 2022-01-05 DIAGNOSIS — E1165 Type 2 diabetes mellitus with hyperglycemia: Secondary | ICD-10-CM | POA: Diagnosis not present

## 2022-01-05 DIAGNOSIS — E6609 Other obesity due to excess calories: Secondary | ICD-10-CM | POA: Insufficient documentation

## 2022-01-05 DIAGNOSIS — Z6835 Body mass index (BMI) 35.0-35.9, adult: Secondary | ICD-10-CM

## 2022-01-05 DIAGNOSIS — E781 Pure hyperglyceridemia: Secondary | ICD-10-CM

## 2022-01-05 DIAGNOSIS — Z23 Encounter for immunization: Secondary | ICD-10-CM

## 2022-01-05 DIAGNOSIS — E1169 Type 2 diabetes mellitus with other specified complication: Secondary | ICD-10-CM | POA: Insufficient documentation

## 2022-01-05 NOTE — Progress Notes (Signed)
Subjective:    Patient ID: Jacob Andrews, male    DOB: 03-04-1986, 36 y.o.   MRN: [id number]  HPI  Patient presents to clinic today for follow-up of labs.  His recent A1c was 9.5%, triglycerides 423.  This is a new diagnosis of diabetes for him. He does have a family history of diabetes.    Review of Systems     Past Medical History:  Diagnosis Date   Bone spur    right knee   Depression    IBS (irritable bowel syndrome)    Kidney stone    current stone outside kidney after surgery.    OSA (obstructive sleep apnea)    no CPAP- could not get "adjusted" correctly   Vertigo     No current outpatient medications on file.   No current facility-administered medications for this visit.    Allergies  Allergen Reactions   Cinnamon Swelling    Lip and mouth swellig   Oxycodone Hcl     Other reaction(s): Headache   Oxycodone Other (See Comments)    Other reaction(s): Headache, migraine Other reaction(s): Headache    Family History  Problem Relation Age of Onset   Diabetes Father    Hypertension Mother    Heart disease Maternal Grandmother        heart attack   Heart disease Paternal Grandfather    Stroke Paternal Grandfather     Social History   Socioeconomic History   Marital status: Single    Spouse name: Not on file   Number of children: Not on file   Years of education: Not on file   Highest education level: Not on file  Occupational History   Not on file  Tobacco Use   Smoking status: Never   Smokeless tobacco: Never  Vaping Use   Vaping Use: Never used  Substance and Sexual Activity   Alcohol use: Yes    Alcohol/week: 0.0 standard drinks of alcohol   Drug use: Not Currently    Types: Marijuana   Sexual activity: Yes    Partners: Female    Comment: partner is on the pill.   Other Topics Concern   Not on file  Social History Narrative   Not on file   Social Determinants of Health   Financial Resource Strain: Not on file  Food Insecurity:  Not on file  Transportation Needs: Not on file  Physical Activity: Not on file  Stress: Not on file  Social Connections: Not on file  Intimate Partner Violence: Not on file     Constitutional: Denies fever, malaise, fatigue, headache or abrupt weight changes.  HEENT: Denies eye pain, eye redness, ear pain, ringing in the ears, wax buildup, runny nose, nasal congestion, bloody nose, or sore throat. Respiratory: Denies difficulty breathing, shortness of breath, cough or sputum production.   Cardiovascular: Denies chest pain, chest tightness, palpitations or swelling in the hands or feet.  Gastrointestinal: Denies abdominal pain, bloating, constipation, diarrhea or blood in the stool.  GU: Denies urgency, frequency, pain with urination, burning sensation, blood in urine, odor or discharge. Musculoskeletal: Denies decrease in range of motion, difficulty with gait, muscle pain or joint pain and swelling.  Skin: Denies redness, rashes, lesions or ulcercations.  Neurological: Denies dizziness, difficulty with memory, difficulty with speech or problems with balance and coordination.    No other specific complaints in a complete review of systems (except as listed in HPI above).  Objective:   Physical Exam  BP 132/72 (  BP Location: Left Arm, Patient Position: Sitting, Cuff Size: Large)   Pulse 97   Temp (!) 96.9 F (36.1 C) (Temporal)   Wt 244 lb (110.7 kg)   SpO2 100%   BMI 35.01 kg/m   Wt Readings from Last 3 Encounters:  12/29/21 244 lb (110.7 kg)  05/04/20 242 lb 9.6 oz (110 kg)  04/20/20 244 lb (110.7 kg)    General: Appears her stated age, obese in NAD. Skin: Warm, dry and intact. No ulcerations noted. HEENT: Head: normal shape and size; Eyes: sclera white, no icterus, conjunctiva pink, PERRLA and EOMs intact;  Cardiovascular: Normal rate and rhythm. Pulmonary/Chest: Normal effort and positive vesicular breath sounds. No respiratory distress. No wheezes, rales or ronchi noted.   Neurological: Alert and oriented.   BMET    Component Value Date/Time   NA 137 12/29/2021 0844   NA 136 03/23/2012 1414   K 3.9 12/29/2021 0844   K 3.8 03/23/2012 1414   CL 102 12/29/2021 0844   CL 101 03/23/2012 1414   CO2 25 12/29/2021 0844   CO2 27 03/23/2012 1414   GLUCOSE 224 (H) 12/29/2021 0844   GLUCOSE 92 03/23/2012 1414   BUN 15 12/29/2021 0844   BUN 9 03/23/2012 1414   CREATININE 0.67 12/29/2021 0844   CALCIUM 9.7 12/29/2021 0844   CALCIUM 8.9 03/23/2012 1414   GFRNONAA >60 05/14/2018 1906   GFRNONAA >89 12/05/2015 0916   GFRAA >60 05/14/2018 1906   GFRAA >89 12/05/2015 0916    Lipid Panel     Component Value Date/Time   CHOL 225 (H) 12/29/2021 0844   TRIG 423 (H) 12/29/2021 0844   HDL 31 (L) 12/29/2021 0844   CHOLHDL 7.3 (H) 12/29/2021 0844   VLDL 30.0 10/09/2016 0938   [place]  12/29/2021 0844     Comment:     . LDL cholesterol not calculated. Triglyceride levels greater than 400 mg/dL invalidate calculated LDL results. . Reference range: <100 . Desirable range <100 mg/dL for primary prevention;   <70 mg/dL for patients with CHD or diabetic patients  with > or = 2 CHD risk factors. Marland Kitchen LDL-C is now calculated using the Martin-Hopkins  calculation, which is a validated novel method providing  better accuracy than the Friedewald equation in the  estimation of LDL-C.  Cresenciano Genre et al. Annamaria Helling. 2703;500(93): 2061-2068  (http://education.[facility].com/faq/FAQ164)     CBC    Component Value Date/Time   WBC 6.4 12/29/2021 0844   RBC 5.08 12/29/2021 0844   HGB 15.9 12/29/2021 0844   HGB 15.2 03/23/2012 1414   HCT 46.6 12/29/2021 0844   HCT 43.7 03/23/2012 1414   PLT 272 12/29/2021 0844   PLT 248 03/23/2012 1414   MCV 91.7 12/29/2021 0844   MCV 89 03/23/2012 1414   MCH 31.3 12/29/2021 0844   MCHC 34.1 12/29/2021 0844   RDW 12.2 12/29/2021 0844   RDW 12.7 03/23/2012 1414   LYMPHSABS 3.1 05/14/2018 1906   LYMPHSABS 1.8 03/23/2012 1414    MONOABS 0.7 05/14/2018 1906   MONOABS 0.7 03/23/2012 1414   EOSABS 0.2 05/14/2018 1906   EOSABS 0.1 03/23/2012 1414   BASOSABS 0.1 05/14/2018 1906   BASOSABS 0.1 03/23/2012 1414    Hgb A1C Lab Results  Component Value Date   HGBA1C 9.5 (H) 12/29/2021           Assessment & Plan:     RTC in 3 months, follow-up chronic conditions Webb Silversmith, NP

## 2022-01-05 NOTE — Addendum Note (Signed)
Addended by: Ashley Royalty E on: 01/05/2022 04:52 PM   Modules accepted: Orders

## 2022-01-05 NOTE — Assessment & Plan Note (Signed)
Discussed diabetes and standards of medical care Referral to diabetes nutrition, he is not interested in referral to diabetes education at this time He would like to try 3 months of lifestyle therapy including low-carb diet and exercise for weight loss Encourage routine eye exam Encouraged foot exam Flu and Pneumovax today

## 2022-01-05 NOTE — Patient Instructions (Signed)

## 2022-01-05 NOTE — Assessment & Plan Note (Signed)
Encourage low-carb diet and exercise for weight loss 

## 2022-02-06 ENCOUNTER — Ambulatory Visit: Payer: Self-pay | Admitting: *Deleted

## 2022-02-14 LAB — HM DIABETES EYE EXAM

## 2022-02-20 ENCOUNTER — Encounter: Payer: Self-pay | Admitting: Adult Health

## 2022-02-20 ENCOUNTER — Ambulatory Visit (INDEPENDENT_AMBULATORY_CARE_PROVIDER_SITE_OTHER): Payer: 59 | Admitting: Adult Health

## 2022-02-20 VITALS — BP 126/78 | HR 104 | Temp 98.7°F | Ht 70.0 in | Wt 240.0 lb

## 2022-02-20 DIAGNOSIS — R0683 Snoring: Secondary | ICD-10-CM

## 2022-02-20 DIAGNOSIS — Z6835 Body mass index (BMI) 35.0-35.9, adult: Secondary | ICD-10-CM

## 2022-02-20 DIAGNOSIS — G4730 Sleep apnea, unspecified: Secondary | ICD-10-CM | POA: Diagnosis not present

## 2022-02-20 NOTE — Assessment & Plan Note (Signed)
Healthy weight loss discussed 

## 2022-02-20 NOTE — Progress Notes (Signed)
$'@Patient's$  ID: Jacob Andrews, male    DOB: 03-09-1986, 36 y.o.   MRN: [id number]  Chief Complaint  Patient presents with   sleep consult    Referring provider: Jearld Fenton, NP  HPI: 36 year old male seen for sleep consult February 20, 2022 to establish for sleep apnea.   TEST/EVENTS :  Sleep study February 21, 2015 showed moderate sleep apnea with a AHI 27.8/hour with SPO2 low at 81%  02/20/2022 Sleep consult  Patient presents for sleep consult today.  Kindly referred by primary care provider Webb Silversmith, NP .  Patient was diagnosed with sleep apnea in November 2016.  Sleep study report shows moderate sleep apnea with AHI at 27.8 with SPO2 low at 81%.  Patient complains of ongoing snoring, daytime sleepiness and restless sleep.  Had been tried on CPAP but was unable to tolerate.  Patient typically goes to bed about 10 PM to 2 AM.  (Very time since he has a newborn at home) takes 10 minutes to go to sleep.  Is up several times throughout the night.  Gets up at 6 AM.  Patient's weight is up 20 or so pounds.  Current weight is at 244 pounds.  Patient denies any history of congestive heart failure or stroke.  No removable dental work.  Minimum caffeine intake.  Epworth score is 9 out of 24.  Typically gets sleepy if he sits down to watch TV or read.  And in the afternoon hours and after eating.  Does have loud snoring.  And wakes up gasping for air at times.  No symptoms suspicious for cataplexy or sleep paralysis.  Social history patient is married.  Has a 83-monthold daughter.  He is self-employed with [employer], [employer].  Family history positive for heart disease, stroke and sleep apnea   Allergies  Allergen Reactions   Cinnamon Swelling    Lip and mouth swellig   Oxycodone Hcl     Other reaction(s): Headache   Oxycodone Other (See Comments)    Other reaction(s): Headache, migraine Other reaction(s): Headache    Immunization History  Administered Date(s) Administered    Influenza,inj,Quad PF,6+ Mos 02/07/2015, 01/14/2017, 01/27/2018, 01/05/2022   PFIZER SARS-COV-2 Pediatric Vaccination 5-198yr04/16/2021   PFIZER(Purple Top)SARS-COV-2 Vaccination 07/10/2019   Pneumococcal Polysaccharide-23 01/05/2022   Tdap 01/14/2017    Past Medical History:  Diagnosis Date   Bone spur    right knee   Depression    IBS (irritable bowel syndrome)    Kidney stone    current stone outside kidney after surgery.    OSA (obstructive sleep apnea)    no CPAP- could not get "adjusted" correctly   Vertigo     Tobacco History: Social History   Tobacco Use  Smoking Status Never  Smokeless Tobacco Never   Counseling given: Not Answered   No outpatient medications prior to visit.   No facility-administered medications prior to visit.     Review of Systems:   Constitutional:   No  weight loss, night sweats,  Fevers, chills, +fatigue, or  lassitude.  HEENT:   No headaches,  Difficulty swallowing,  Tooth/dental problems, or  Sore throat,                No sneezing, itching, ear ache, nasal congestion, post nasal drip,   CV:  No chest pain,  Orthopnea, PND, swelling in lower extremities, anasarca, dizziness, palpitations, syncope.   GI  No heartburn, indigestion, abdominal pain, nausea, vomiting, diarrhea, change in bowel habits,  loss of appetite, bloody stools.   Resp: No shortness of breath with exertion or at rest.  No excess mucus, no productive cough,  No non-productive cough,  No coughing up of blood.  No change in color of mucus.  No wheezing.  No chest wall deformity  Skin: no rash or lesions.  GU: no dysuria, change in color of urine, no urgency or frequency.  No flank pain, no hematuria   MS:  No joint pain or swelling.  No decreased range of motion.  No back pain.    Physical Exam  BP 126/78 (BP Location: Left Arm, Cuff Size: Large)   Pulse (!) 104   Temp 98.7 F (37.1 C) (Temporal)   Ht '5\' 10"'$  (1.778 m)   Wt 240 lb (108.9 kg)   SpO2 96%    BMI 34.44 kg/m   GEN: A/Ox3; pleasant , NAD, well nourished    HEENT:  [place]ilder/AT,  EACs-clear, TMs-wnl, NOSE-clear, THROAT-clear, no lesions, no postnasal drip or exudate noted. Class 3 MP airway , Tonsills 1- 2+  NECK:  Supple w/ fair ROM; no JVD; normal carotid impulses w/o bruits; no thyromegaly or nodules palpated; no lymphadenopathy.    RESP  Clear  P & A; w/o, wheezes/ rales/ or rhonchi. no accessory muscle use, no dullness to percussion  CARD:  RRR, no m/r/g, no peripheral edema, pulses intact, no cyanosis or clubbing.  GI:   Soft & nt; nml bowel sounds; no organomegaly or masses detected.   Musco: Warm bil, no deformities or joint swelling noted.   Neuro: alert, no focal deficits noted.    Skin: Warm, no lesions or rashes    Lab Results:  CBC    Component Value Date/Time   WBC 6.4 12/29/2021 0844   RBC 5.08 12/29/2021 0844   HGB 15.9 12/29/2021 0844   HGB 15.2 03/23/2012 1414   HCT 46.6 12/29/2021 0844   HCT 43.7 03/23/2012 1414   PLT 272 12/29/2021 0844   PLT 248 03/23/2012 1414   MCV 91.7 12/29/2021 0844   MCV 89 03/23/2012 1414   MCH 31.3 12/29/2021 0844   MCHC 34.1 12/29/2021 0844   RDW 12.2 12/29/2021 0844   RDW 12.7 03/23/2012 1414   LYMPHSABS 3.1 05/14/2018 1906   LYMPHSABS 1.8 03/23/2012 1414   MONOABS 0.7 05/14/2018 1906   MONOABS 0.7 03/23/2012 1414   EOSABS 0.2 05/14/2018 1906   EOSABS 0.1 03/23/2012 1414   BASOSABS 0.1 05/14/2018 1906   BASOSABS 0.1 03/23/2012 1414    BMET    Component Value Date/Time   NA 137 12/29/2021 0844   NA 136 03/23/2012 1414   K 3.9 12/29/2021 0844   K 3.8 03/23/2012 1414   CL 102 12/29/2021 0844   CL 101 03/23/2012 1414   CO2 25 12/29/2021 0844   CO2 27 03/23/2012 1414   GLUCOSE 224 (H) 12/29/2021 0844   GLUCOSE 92 03/23/2012 1414   BUN 15 12/29/2021 0844   BUN 9 03/23/2012 1414   CREATININE 0.67 12/29/2021 0844   CALCIUM 9.7 12/29/2021 0844   CALCIUM 8.9 03/23/2012 1414   GFRNONAA >60 05/14/2018 1906    GFRNONAA >89 12/05/2015 0916   GFRAA >60 05/14/2018 1906   GFRAA >89 12/05/2015 0916    BNP No results found for: "BNP"  ProBNP No results found for: "PROBNP"  Imaging: No results found.        No data to display          No results found for: "NITRICOXIDE"  Assessment & Plan:   Sleep apnea in adult History of moderate obstructive sleep apnea, snoring, restless sleep, daytime sleepiness all concerning for ongoing sleep apnea.  We will set patient up for repeat sleep study. Patient education given.  - discussed how weight can impact sleep and risk for sleep disordered breathing - discussed options to assist with weight loss: combination of diet modification, cardiovascular and strength training exercises   - had an extensive discussion regarding the adverse health consequences related to untreated sleep disordered breathing - specifically discussed the risks for hypertension, coronary artery disease, cardiac dysrhythmias, cerebrovascular disease, and diabetes - lifestyle modification discussed   - discussed how sleep disruption can increase risk of accidents, particularly when driving - safe driving practices were discussed   Plan  Patient Instructions  Set up for home sleep study Healthy sleep regimen Do not drive if sleepy Work on healthy weight loss Follow-up in 2 months to review results and treatment plan    Class 2 obesity due to excess calories with body mass index (BMI) of 35.0 to 35.9 in adult Healthy weight loss discussed     Rexene Edison, NP 02/20/2022

## 2022-02-20 NOTE — Patient Instructions (Signed)
Set up for home sleep study Healthy sleep regimen Do not drive if sleepy Work on healthy weight loss Follow-up in 2 months to review results and treatment plan

## 2022-02-20 NOTE — Assessment & Plan Note (Signed)
History of moderate obstructive sleep apnea, snoring, restless sleep, daytime sleepiness all concerning for ongoing sleep apnea.  We will set patient up for repeat sleep study. Patient education given.  - discussed how weight can impact sleep and risk for sleep disordered breathing - discussed options to assist with weight loss: combination of diet modification, cardiovascular and strength training exercises   - had an extensive discussion regarding the adverse health consequences related to untreated sleep disordered breathing - specifically discussed the risks for hypertension, coronary artery disease, cardiac dysrhythmias, cerebrovascular disease, and diabetes - lifestyle modification discussed   - discussed how sleep disruption can increase risk of accidents, particularly when driving - safe driving practices were discussed   Plan  Patient Instructions  Set up for home sleep study Healthy sleep regimen Do not drive if sleepy Work on healthy weight loss Follow-up in 2 months to review results and treatment plan

## 2022-02-20 NOTE — Progress Notes (Signed)
Reviewed and agree with assessment/plan.   Chesley Mires, MD [facility] Pulmonary/Critical Care 02/20/2022, 10:48 AM Pager:  [phone]

## 2022-03-05 ENCOUNTER — Ambulatory Visit: Payer: Self-pay | Admitting: *Deleted

## 2022-04-06 ENCOUNTER — Encounter: Payer: Self-pay | Admitting: Internal Medicine

## 2022-04-06 ENCOUNTER — Ambulatory Visit (INDEPENDENT_AMBULATORY_CARE_PROVIDER_SITE_OTHER): Payer: 59 | Admitting: Internal Medicine

## 2022-04-06 VITALS — BP 128/84 | HR 88 | Temp 97.1°F | Wt 244.0 lb

## 2022-04-06 DIAGNOSIS — G8929 Other chronic pain: Secondary | ICD-10-CM | POA: Diagnosis not present

## 2022-04-06 DIAGNOSIS — R69 Illness, unspecified: Secondary | ICD-10-CM | POA: Diagnosis not present

## 2022-04-06 DIAGNOSIS — E1165 Type 2 diabetes mellitus with hyperglycemia: Secondary | ICD-10-CM

## 2022-04-06 DIAGNOSIS — F419 Anxiety disorder, unspecified: Secondary | ICD-10-CM | POA: Diagnosis not present

## 2022-04-06 DIAGNOSIS — G4730 Sleep apnea, unspecified: Secondary | ICD-10-CM | POA: Diagnosis not present

## 2022-04-06 DIAGNOSIS — E781 Pure hyperglyceridemia: Secondary | ICD-10-CM

## 2022-04-06 DIAGNOSIS — Z6835 Body mass index (BMI) 35.0-35.9, adult: Secondary | ICD-10-CM

## 2022-04-06 DIAGNOSIS — K580 Irritable bowel syndrome with diarrhea: Secondary | ICD-10-CM

## 2022-04-06 DIAGNOSIS — M5441 Lumbago with sciatica, right side: Secondary | ICD-10-CM

## 2022-04-06 DIAGNOSIS — F32A Depression, unspecified: Secondary | ICD-10-CM

## 2022-04-06 LAB — POCT GLYCOSYLATED HEMOGLOBIN (HGB A1C): Hemoglobin A1C: 7.7 % — AB (ref 4.0–5.6)

## 2022-04-06 NOTE — Assessment & Plan Note (Signed)
Sleep study pending Encourage weight loss as this can produce sleep apnea symptoms

## 2022-04-06 NOTE — Assessment & Plan Note (Signed)
Stable off meds. ?

## 2022-04-06 NOTE — Progress Notes (Signed)
Subjective:    Patient ID: Jacob Andrews, male    DOB: [birth year], 36 y.o.   MRN: [id number]  HPI  Patient presents to clinic today for 50-monthfollow-up of chronic conditions.  OSA: He averages 8 hours of sleep per night without the use of his CPAP.  Sleep study pending.  IBS: Mainly diarrhea.  He takes Imodium as needed with some relief of symptoms.  Colonoscopy from 12/2016 reviewed.  Anxiety and Depression: He is not taking any medications for this.  He is not currently seeing a therapist.  Denies SI/HI.  HLD: His last LDL was not calculated, triglycerides 423, 12/2021.  He is not taking any cholesterol-lowering medication at this time.  He does not consume a low-fat diet.  Sciatica: Status post surgical intervention in 2022.  He takes Ibuprofen and Tylenol as needed with some relief of symptoms.  He does not follow with neurosurgery.  DM2: His last A1c was 9.5%, 12/2021.  He is not taking any oral diabetic medication at this time.  He does not check his sugars.  He does not check his feet routinely.  His last eye exam was > 1 year ago.  Flu 12/2021.  Pneumovax 12/2021.  CSouthsidex 2.  Review of Systems     Past Medical History:  Diagnosis Date   Bone spur    right knee   Depression    IBS (irritable bowel syndrome)    Kidney stone    current stone outside kidney after surgery.    OSA (obstructive sleep apnea)    no CPAP- could not get "adjusted" correctly   Vertigo     No current outpatient medications on file.   No current facility-administered medications for this visit.    Allergies  Allergen Reactions   Cinnamon Swelling    Lip and mouth swellig   Oxycodone Hcl     Other reaction(s): Headache   Oxycodone Other (See Comments)    Other reaction(s): Headache, migraine Other reaction(s): Headache    Family History  Problem Relation Age of Onset   Diabetes Father    Hypertension Mother    Heart disease Maternal Grandmother        heart attack   Heart  disease Paternal Grandfather    Stroke Paternal Grandfather     Social History   Socioeconomic History   Marital status: Single    Spouse name: Not on file   Number of children: Not on file   Years of education: Not on file   Highest education level: Not on file  Occupational History   Not on file  Tobacco Use   Smoking status: Never   Smokeless tobacco: Never  Vaping Use   Vaping Use: Never used  Substance and Sexual Activity   Alcohol use: Yes    Alcohol/week: 0.0 standard drinks of alcohol   Drug use: Not Currently    Types: Marijuana   Sexual activity: Yes    Partners: Female    Comment: partner is on the pill.   Other Topics Concern   Not on file  Social History Narrative   Not on file   Social Determinants of Health   Financial Resource Strain: Not on file  Food Insecurity: Not on file  Transportation Needs: Not on file  Physical Activity: Not on file  Stress: Not on file  Social Connections: Not on file  Intimate Partner Violence: Not on file     Constitutional: Denies fever, malaise, fatigue, headache or abrupt  weight changes.  HEENT: Denies eye pain, eye redness, ear pain, ringing in the ears, wax buildup, runny nose, nasal congestion, bloody nose, or sore throat. Respiratory: Denies difficulty breathing, shortness of breath, cough or sputum production.   Cardiovascular: Denies chest pain, chest tightness, palpitations or swelling in the hands or feet.  Gastrointestinal: Patient reports intermittent diarrhea.  Denies abdominal pain, bloating, constipation, or blood in the stool.  GU: Denies urgency, frequency, pain with urination, burning sensation, blood in urine, odor or discharge. Musculoskeletal: Patient reports chronic back pain.  Denies decrease in range of motion, difficulty with gait, muscle pain or joint swelling.  Skin: Denies redness, rashes, lesions or ulcercations.  Neurological: Patient reports paresthesia of right lower extremity.  Denies  dizziness, difficulty with memory, difficulty with speech or problems with balance and coordination.  Psych: Patient has a history of anxiety and depression.  Denies SI/HI.  No other specific complaints in a complete review of systems (except as listed in HPI above).  Objective:   Physical Exam   BP 128/84 (BP Location: Right Arm, Patient Position: Sitting, Cuff Size: Large)   Pulse 88   Temp (!) 97.1 F (36.2 C) (Temporal)   Wt 244 lb (110.7 kg)   SpO2 99%   BMI 35.01 kg/m   Wt Readings from Last 3 Encounters:  02/20/22 240 lb (108.9 kg)  01/05/22 244 lb (110.7 kg)  12/29/21 244 lb (110.7 kg)    General: Appears his stated age, obese, in NAD. Skin: Warm, dry and intact. No ulcerations noted. HEENT: Head: normal shape and size; Eyes: sclera white, no icterus, conjunctiva pink, PERRLA and EOMs intact;  Cardiovascular: Normal rate and rhythm. S1,S2 noted.  No murmur, rubs or gallops noted. No JVD or BLE edema.  Pulmonary/Chest: Normal effort and positive vesicular breath sounds. No respiratory distress. No wheezes, rales or ronchi noted.  Abdomen: Normal bowel sounds.  Musculoskeletal:  No difficulty with gait.  Neurological: Alert and oriented.  Psychiatric: Mood and affect normal. Behavior is normal. Judgment and thought content normal.     BMET    Component Value Date/Time   NA 137 12/29/2021 0844   NA 136 03/23/2012 1414   K 3.9 12/29/2021 0844   K 3.8 03/23/2012 1414   CL 102 12/29/2021 0844   CL 101 03/23/2012 1414   CO2 25 12/29/2021 0844   CO2 27 03/23/2012 1414   GLUCOSE 224 (H) 12/29/2021 0844   GLUCOSE 92 03/23/2012 1414   BUN 15 12/29/2021 0844   BUN 9 03/23/2012 1414   CREATININE 0.67 12/29/2021 0844   CALCIUM 9.7 12/29/2021 0844   CALCIUM 8.9 03/23/2012 1414   GFRNONAA >60 05/14/2018 1906   GFRNONAA >89 12/05/2015 0916   GFRAA >60 05/14/2018 1906   GFRAA >89 12/05/2015 0916    Lipid Panel     Component Value Date/Time   CHOL 225 (H)  12/29/2021 0844   TRIG 423 (H) 12/29/2021 0844   HDL 31 (L) 12/29/2021 0844   CHOLHDL 7.3 (H) 12/29/2021 0844   VLDL 30.0 10/09/2016 0938   LDLCALC  12/29/2021 0844     Comment:     . LDL cholesterol not calculated. Triglyceride levels greater than 400 mg/dL invalidate calculated LDL results. . Reference range: <100 . Desirable range <100 mg/dL for primary prevention;   <70 mg/dL for patients with CHD or diabetic patients  with > or = 2 CHD risk factors. Marland Kitchen LDL-C is now calculated using the Martin-Hopkins  calculation, which is a validated novel  method providing  better accuracy than the Friedewald equation in the  estimation of LDL-C.  Cresenciano Genre et al. Annamaria Helling. 7944;461(90): 2061-2068  (http://education.[facility].com/faq/FAQ164)     CBC    Component Value Date/Time   WBC 6.4 12/29/2021 0844   RBC 5.08 12/29/2021 0844   HGB 15.9 12/29/2021 0844   HGB 15.2 03/23/2012 1414   HCT 46.6 12/29/2021 0844   HCT 43.7 03/23/2012 1414   PLT 272 12/29/2021 0844   PLT 248 03/23/2012 1414   MCV 91.7 12/29/2021 0844   MCV 89 03/23/2012 1414   MCH 31.3 12/29/2021 0844   MCHC 34.1 12/29/2021 0844   RDW 12.2 12/29/2021 0844   RDW 12.7 03/23/2012 1414   LYMPHSABS 3.1 05/14/2018 1906   LYMPHSABS 1.8 03/23/2012 1414   MONOABS 0.7 05/14/2018 1906   MONOABS 0.7 03/23/2012 1414   EOSABS 0.2 05/14/2018 1906   EOSABS 0.1 03/23/2012 1414   BASOSABS 0.1 05/14/2018 1906   BASOSABS 0.1 03/23/2012 1414    Hgb A1C Lab Results  Component Value Date   HGBA1C 9.5 (H) 12/29/2021           Assessment & Plan:     RTC in 3 months for annual exam Webb Silversmith, NP

## 2022-04-06 NOTE — Assessment & Plan Note (Signed)
Encouraged high-fiber diet Continue Imodium as needed

## 2022-04-06 NOTE — Assessment & Plan Note (Signed)
Lipid profile today Encouraged him to consume a low-fat diet and increase aerobic exercise

## 2022-04-06 NOTE — Assessment & Plan Note (Signed)
POCT A1c 7.7% down from 9.5% with diet alone We will check urine microalbumin today Encourage low-carb diet and exercise for weight loss He would like to hold off on medication management at this time Encourage routine eye exam Encouraged routine foot exam Flu and Pneumovax UTD Encouraged him to get his COVID-vaccine

## 2022-04-06 NOTE — Assessment & Plan Note (Signed)
Encourage regular stretching and core strengthening Continue Tylenol and  ibuprofen as needed

## 2022-04-06 NOTE — Assessment & Plan Note (Signed)
Encourage diet and exercise for weight loss 

## 2022-04-06 NOTE — Patient Instructions (Signed)

## 2022-04-07 LAB — LIPID PANEL
Cholesterol: 214 mg/dL — ABNORMAL HIGH (ref <200)
HDL: 28 mg/dL — ABNORMAL LOW (ref >=40)
LDL Cholesterol (Calc): 144 mg/dL (calc) — ABNORMAL HIGH
Non-HDL Cholesterol (Calc): 186 mg/dL (calc) — ABNORMAL HIGH (ref <130)
Total CHOL/HDL Ratio: 7.6 (calc) — ABNORMAL HIGH (ref <5.0)
Triglycerides: 296 mg/dL — ABNORMAL HIGH (ref <150)

## 2022-04-07 LAB — COMPLETE METABOLIC PANEL WITH GFR
AG Ratio: 1.7 (calc) (ref 1.0–2.5)
ALT: 31 U/L (ref 9–46)
AST: 16 U/L (ref 10–40)
Albumin: 4.5 g/dL (ref 3.6–5.1)
Alkaline phosphatase (APISO): 57 U/L (ref 36–130)
BUN: 16 mg/dL (ref 7–25)
CO2: 27 mmol/L (ref 20–32)
Calcium: 9.5 mg/dL (ref 8.6–10.3)
Chloride: 104 mmol/L (ref 98–110)
Creat: 0.75 mg/dL (ref 0.60–1.26)
Globulin: 2.7 g/dL (calc) (ref 1.9–3.7)
Glucose, Bld: 163 mg/dL — ABNORMAL HIGH (ref 65–99)
Potassium: 4.1 mmol/L (ref 3.5–5.3)
Sodium: 140 mmol/L (ref 135–146)
Total Bilirubin: 0.4 mg/dL (ref 0.2–1.2)
Total Protein: 7.2 g/dL (ref 6.1–8.1)
eGFR: 121 mL/min/{1.73_m2} (ref >=60)

## 2022-04-07 LAB — MICROALBUMIN / CREATININE URINE RATIO
Creatinine, Urine: 123 mg/dL (ref 20–320)
Microalb Creat Ratio: 18 mcg/mg creat (ref <30)
Microalb, Ur: 2.2 mg/dL

## 2022-04-18 ENCOUNTER — Ambulatory Visit: Payer: 59

## 2022-04-18 DIAGNOSIS — R0683 Snoring: Secondary | ICD-10-CM

## 2022-04-18 DIAGNOSIS — G4733 Obstructive sleep apnea (adult) (pediatric): Secondary | ICD-10-CM

## 2022-04-20 DIAGNOSIS — G4733 Obstructive sleep apnea (adult) (pediatric): Secondary | ICD-10-CM | POA: Diagnosis not present

## 2022-04-27 ENCOUNTER — Telehealth (INDEPENDENT_AMBULATORY_CARE_PROVIDER_SITE_OTHER): Payer: 59 | Admitting: Adult Health

## 2022-04-27 ENCOUNTER — Encounter: Payer: Self-pay | Admitting: Adult Health

## 2022-04-27 DIAGNOSIS — G4730 Sleep apnea, unspecified: Secondary | ICD-10-CM | POA: Diagnosis not present

## 2022-04-27 NOTE — Progress Notes (Signed)
Reviewed and agree with assessment/plan.   Chesley Mires, MD [facility] Pulmonary/Critical Care 04/27/2022, 9:50 AM Pager:  [phone]

## 2022-04-27 NOTE — Progress Notes (Signed)
Virtual Visit via Video Note  I connected with Jacob Andrews on 04/27/22 at  9:00 AM EST by a video enabled telemedicine application and verified that I am speaking with the correct person using two identifiers.  Location: Patient: Home  Provider: Office    I discussed the limitations of evaluation and management by telemedicine and the availability of in person appointments. The patient expressed understanding and agreed to proceed.  History of Present Illness: 37 year old male seen for sleep consult February 20, 2022 to establish for sleep apnea  Today's video visit is a 69-monthfollow-up for sleep apnea.  Patient was seen in November 2023 for sleep consult to establish for sleep apnea.  Patient previously been diagnosed with moderate to severe sleep apnea in 2016 was given CPAP but was unable to tolerate.  Patient says it was mainly he just did not get a lot of instruction did not understand why he should wear it.  Patient is continue to have ongoing symptoms with daytime sleepiness, snoring and restless sleep.  Patient was set up for sleep study January third 2024 that showed very severe sleep apnea with AHI at 77.2 and SpO2 low at 78%.  We went over his sleep study results in detail.  Patient education was given.  Patient will proceed with CPAP therapy  Past Medical History:  Diagnosis Date   Bone spur    right knee   Depression    IBS (irritable bowel syndrome)    Kidney stone    current stone outside kidney after surgery.    OSA (obstructive sleep apnea)    no CPAP- could not get "adjusted" correctly   Vertigo     No current outpatient medications on file prior to visit.   No current facility-administered medications on file prior to visit.       Observations/Objective: Sleep study February 21, 2015 showed moderate sleep apnea with a AHI 27.8/hour with SPO2 low at 81%   Sleep study January 3,, 2024 showed severe sleep apnea with AHI 77.2/and SpO2 low at 78%  Assessment and  Plan: Very severe sleep apnea.  Will begin CPAP immediately.  Begin auto CPAP 5 to 15 cm H2O.  Mask of choice.  Patient does have a full beard and mustache may need to try nasal mask for more comfort.  Plan  Patient Instructions  Begin CPAP at bedtime, goal was to wear all night long for at least 6 or more hours Work on healthy weight loss Do not drive if sleepy Healthy sleep regimen Follow-up in 2 months and as needed   .  Follow Up Instructions: Follow-up in 2 months and as needed   I discussed the assessment and treatment plan with the patient. The patient was provided an opportunity to ask questions and all were answered. The patient agreed with the plan and demonstrated an understanding of the instructions.   The patient was advised to call back or seek an in-person evaluation if the symptoms worsen or if the condition fails to improve as anticipated.  I provided 22 minutes of non-face-to-face time during this encounter.   TRexene Edison NP

## 2022-04-27 NOTE — Patient Instructions (Signed)
Begin CPAP at bedtime, goal was to wear all night long for at least 6 or more hours Work on healthy weight loss Do not drive if sleepy Healthy sleep regimen Follow-up in 2 months and as needed

## 2022-04-27 NOTE — Progress Notes (Deleted)
Virtual Visit via Video Note  I connected with Alinda Money on 04/27/22 at  9:00 AM EST by a video enabled telemedicine application and verified that I am speaking with the correct person using two identifiers.  Location: Patient: *** Provider: ***   I discussed the limitations of evaluation and management by telemedicine and the availability of in person appointments. The patient expressed understanding and agreed to proceed.  History of Present Illness:    Observations/Objective:   Assessment and Plan:   Follow Up Instructions:    I discussed the assessment and treatment plan with the patient. The patient was provided an opportunity to ask questions and all were answered. The patient agreed with the plan and demonstrated an understanding of the instructions.   The patient was advised to call back or seek an in-person evaluation if the symptoms worsen or if the condition fails to improve as anticipated.  I provided *** minutes of non-face-to-face time during this encounter.   Rexene Edison, NP

## 2022-04-30 ENCOUNTER — Telehealth: Payer: Self-pay | Admitting: Adult Health

## 2022-04-30 NOTE — Addendum Note (Signed)
Addended by: Vanessa Barbara on: 04/30/2022 04:19 PM   Modules accepted: Orders

## 2022-04-30 NOTE — Telephone Encounter (Signed)
Called and spoke with patient. He stated that he would like to have a cpap machine ordered for him. He had a video visit with TP on 01/12 and wasn't sure if the order had been placed.   I looked at his chart and did not see an order for a cpap machine. A cpap set at 5-15cm was mentioned in the office note.   He also mentioned being fitted for a mask. He stated that he has a thick beard and TP was concerned about the beard and the cpap mask fitting correctly. I advised him that this sounds like a mask refitting session, which he is ok with doing.   TP, please advise if you are ok with ordering the cpap at 5-15cm as well as a mask refitting session. Thanks!

## 2022-04-30 NOTE — Telephone Encounter (Signed)
Spoke with pt and informed him C-Pap and nasal mask orders would be placed. Orders placed by Nira Conn, RN. Pt stated understanding. Nothing further needed at this time.

## 2022-04-30 NOTE — Telephone Encounter (Signed)
We will put order in now for CPAP AutoSet and DreamWear nasal mask

## 2022-04-30 NOTE — Telephone Encounter (Signed)
Called patient but he did not answer. Left message for patient to call back.

## 2022-05-10 DIAGNOSIS — G4733 Obstructive sleep apnea (adult) (pediatric): Secondary | ICD-10-CM | POA: Diagnosis not present

## 2022-05-30 DIAGNOSIS — G4733 Obstructive sleep apnea (adult) (pediatric): Secondary | ICD-10-CM | POA: Diagnosis not present

## 2022-06-08 ENCOUNTER — Other Ambulatory Visit: Payer: Self-pay

## 2022-06-08 DIAGNOSIS — G4730 Sleep apnea, unspecified: Secondary | ICD-10-CM

## 2022-06-08 NOTE — Telephone Encounter (Signed)
Mychart message sent by pt:  Jacob Andrews "Jacob Andrews"  P [facility] (supporting Tammy S Parrett, NP)21 hours ago (4:11 PM)    Hey Dr. Royal Piedra, I recently received my cpap machine. And I have attempted to use it. But I am having trouble really using it. I have used it during the day to just kind of get used to it, and have a couple (very few) decent nights. But I feel it's just not right. Whether that be the mask itself or the pressure coming from it. I feel like I can't breathe and wake up gasping for air. I understand what apnea means when I lay down but with the mask, I feel like Im really struggling. I just wanted to reach out and see what I need to do or try or anything. Thank you.    Jacob Andrews.     Tammy, please advise.

## 2022-06-08 NOTE — Telephone Encounter (Signed)
Tried to call no answer ,   CPAP download shows patient is on auto CPAP 5 to 15 cm H2O.  AHI 7.4.  Daily average pressure at 9.8 cm. Would recommend changing CPAP pressure to 10 to 20 cm H2O. Refer to DME for mask fitting. If this does not work recommend a CPAP titration study.

## 2022-06-10 DIAGNOSIS — G4733 Obstructive sleep apnea (adult) (pediatric): Secondary | ICD-10-CM | POA: Diagnosis not present

## 2022-06-25 ENCOUNTER — Other Ambulatory Visit (HOSPITAL_BASED_OUTPATIENT_CLINIC_OR_DEPARTMENT_OTHER): Payer: 59 | Admitting: Internal Medicine

## 2022-06-27 ENCOUNTER — Other Ambulatory Visit: Payer: Self-pay

## 2022-06-27 ENCOUNTER — Telehealth: Payer: Self-pay

## 2022-06-27 ENCOUNTER — Encounter: Payer: Self-pay | Admitting: Internal Medicine

## 2022-06-27 ENCOUNTER — Ambulatory Visit (INDEPENDENT_AMBULATORY_CARE_PROVIDER_SITE_OTHER): Payer: 59 | Admitting: Internal Medicine

## 2022-06-27 VITALS — BP 126/76 | HR 89 | Temp 96.5°F | Ht 70.0 in | Wt 242.0 lb

## 2022-06-27 DIAGNOSIS — Z0001 Encounter for general adult medical examination with abnormal findings: Secondary | ICD-10-CM | POA: Diagnosis not present

## 2022-06-27 DIAGNOSIS — Z1211 Encounter for screening for malignant neoplasm of colon: Secondary | ICD-10-CM

## 2022-06-27 DIAGNOSIS — Z86010 Personal history of colonic polyps: Secondary | ICD-10-CM

## 2022-06-27 DIAGNOSIS — E6609 Other obesity due to excess calories: Secondary | ICD-10-CM | POA: Diagnosis not present

## 2022-06-27 DIAGNOSIS — E1165 Type 2 diabetes mellitus with hyperglycemia: Secondary | ICD-10-CM | POA: Diagnosis not present

## 2022-06-27 DIAGNOSIS — Z6834 Body mass index (BMI) 34.0-34.9, adult: Secondary | ICD-10-CM

## 2022-06-27 MED ORDER — NA SULFATE-K SULFATE-MG SULF 17.5-3.13-1.6 GM/177ML PO SOLN: 1.0000 | Freq: Once | ORAL | 0 refills | Status: AC

## 2022-06-27 NOTE — Patient Instructions (Signed)
Health Maintenance, Male Adopting a healthy lifestyle and getting preventive care are important in promoting health and wellness. Ask your health care provider about: The right schedule for you to have regular tests and exams. Things you can do on your own to prevent diseases and keep yourself healthy. What should I know about diet, weight, and exercise? Eat a healthy diet  Eat a diet that includes plenty of vegetables, fruits, low-fat dairy products, and lean protein. Do not eat a lot of foods that are high in solid fats, added sugars, or sodium. Maintain a healthy weight Body mass index (BMI) is a measurement that can be used to identify possible weight problems. It estimates body fat based on height and weight. Your health care provider can help determine your BMI and help you achieve or maintain a healthy weight. Get regular exercise Get regular exercise. This is one of the most important things you can do for your health. Most adults should: Exercise for at least 150 minutes each week. The exercise should increase your heart rate and make you sweat (moderate-intensity exercise). Do strengthening exercises at least twice a week. This is in addition to the moderate-intensity exercise. Spend less time sitting. Even light physical activity can be beneficial. Watch cholesterol and blood lipids Have your blood tested for lipids and cholesterol at 36 years of age, then have this test every 5 years. You may need to have your cholesterol levels checked more often if: Your lipid or cholesterol levels are high. You are older than 36 years of age. You are at high risk for heart disease. What should I know about cancer screening? Many types of cancers can be detected early and may often be prevented. Depending on your health history and family history, you may need to have cancer screening at various ages. This may include screening for: Colorectal cancer. Prostate cancer. Skin cancer. Lung  cancer. What should I know about heart disease, diabetes, and high blood pressure? Blood pressure and heart disease High blood pressure causes heart disease and increases the risk of stroke. This is more likely to develop in people who have high blood pressure readings or are overweight. Talk with your health care provider about your target blood pressure readings. Have your blood pressure checked: Every 3-5 years if you are 18-39 years of age. Every year if you are 40 years old or older. If you are between the ages of 65 and 75 and are a current or former smoker, ask your health care provider if you should have a one-time screening for abdominal aortic aneurysm (AAA). Diabetes Have regular diabetes screenings. This checks your fasting blood sugar level. Have the screening done: Once every three years after age 45 if you are at a normal weight and have a low risk for diabetes. More often and at a younger age if you are overweight or have a high risk for diabetes. What should I know about preventing infection? Hepatitis B If you have a higher risk for hepatitis B, you should be screened for this virus. Talk with your health care provider to find out if you are at risk for hepatitis B infection. Hepatitis C Blood testing is recommended for: Everyone born from 1945 through 1965. Anyone with known risk factors for hepatitis C. Sexually transmitted infections (STIs) You should be screened each year for STIs, including gonorrhea and chlamydia, if: You are sexually active and are younger than 36 years of age. You are older than 36 years of age and your   health care provider tells you that you are at risk for this type of infection. Your sexual activity has changed since you were last screened, and you are at increased risk for chlamydia or gonorrhea. Ask your health care provider if you are at risk. Ask your health care provider about whether you are at high risk for HIV. Your health care provider  may recommend a prescription medicine to help prevent HIV infection. If you choose to take medicine to prevent HIV, you should first get tested for HIV. You should then be tested every 3 months for as long as you are taking the medicine. Follow these instructions at home: Alcohol use Do not drink alcohol if your health care provider tells you not to drink. If you drink alcohol: Limit how much you have to 0-2 drinks a day. Know how much alcohol is in your drink. In the [place] one drink equals one 12 oz bottle of beer (355 mL), one 5 oz glass of wine (148 mL), or one 1 oz glass of hard liquor (44 mL). Lifestyle Do not use any products that contain nicotine or tobacco. These products include cigarettes, chewing tobacco, and vaping devices, such as e-cigarettes. If you need help quitting, ask your health care provider. Do not use street drugs. Do not share needles. Ask your health care provider for help if you need support or information about quitting drugs. General instructions Schedule regular health, dental, and eye exams. Stay current with your vaccines. Tell your health care provider if: You often feel depressed. You have ever been abused or do not feel safe at home. Summary Adopting a healthy lifestyle and getting preventive care are important in promoting health and wellness. Follow your health care provider's instructions about healthy diet, exercising, and getting tested or screened for diseases. Follow your health care provider's instructions on monitoring your cholesterol and blood pressure. This information is not intended to replace advice given to you by your health care provider. Make sure you discuss any questions you have with your health care provider. Document Revised: 08/22/2020 Document Reviewed: 08/22/2020 Elsevier Patient Education  2023 Elsevier Inc.  

## 2022-06-27 NOTE — Assessment & Plan Note (Signed)
Encourage diet and exercise for weight loss 

## 2022-06-27 NOTE — Telephone Encounter (Signed)
Gastroenterology Pre-Procedure Review  Request Date: 07/30/22 Requesting Physician: Dr. Allen Norris  PATIENT REVIEW QUESTIONS: The patient responded to the following health history questions as indicated:    1. Are you having any GI issues? no 2. Do you have a personal history of Polyps? yes (last colonoscopy performed by dr. Allen Norris on 12/31/16 polyps were noted) 3. Do you have a family history of Colon Cancer or Polyps? no 4. Diabetes Mellitus? yes (controlled by diet) 5. Joint replacements in the past 12 months?no 6. Major health problems in the past 3 months?no 7. Any artificial heart valves, MVP, or defibrillator?no    MEDICATIONS & ALLERGIES:    Patient reports the following regarding taking any anticoagulation/antiplatelet therapy:   Plavix, Coumadin, Eliquis, Xarelto, Lovenox, Pradaxa, Brilinta, or Effient? no Aspirin? no  Patient confirms/reports the following medications:  No current outpatient medications on file.   No current facility-administered medications for this visit.    Patient confirms/reports the following allergies:  Allergies  Allergen Reactions   Cinnamon Swelling    Lip and mouth swellig    No orders of the defined types were placed in this encounter.   AUTHORIZATION INFORMATION Primary Insurance: 1D#: Group #:  Secondary Insurance: 1D#: Group #:  SCHEDULE INFORMATION: Date: 07/30/22 Time: Location: [facility]

## 2022-06-27 NOTE — Progress Notes (Unsigned)
Subjective:    Patient ID: Jacob Andrews, male    DOB: 16-Sep-1985, 37 y.o.   MRN: [id number]  HPI  Patient presents to clinic today for his annual exam.  Flu: 12/2021 Tetanus: 01/2017 COVID: Pfizer x 3 Pneumovax: 12/2021 Colon screening: 12/2016, 5 years Vision screening: annually Dentist: biannually  Diet: He does eat meat. He consumes fruits and veggies. He tries to avoid fried foods. He drinks mostly water. Exercise: None  Review of Systems     Past Medical History:  Diagnosis Date   Bone spur    right knee   Depression    IBS (irritable bowel syndrome)    Kidney stone    current stone outside kidney after surgery.    OSA (obstructive sleep apnea)    no CPAP- could not get "adjusted" correctly   Vertigo     No current outpatient medications on file.   No current facility-administered medications for this visit.    Allergies  Allergen Reactions   Cinnamon Swelling    Lip and mouth swellig    Family History  Problem Relation Age of Onset   Diabetes Father    Hypertension Mother    Heart disease Maternal Grandmother        heart attack   Heart disease Paternal Grandfather    Stroke Paternal Grandfather     Social History   Socioeconomic History   Marital status: Single    Spouse name: Not on file   Number of children: Not on file   Years of education: Not on file   Highest education level: Not on file  Occupational History   Not on file  Tobacco Use   Smoking status: Never   Smokeless tobacco: Never  Vaping Use   Vaping Use: Never used  Substance and Sexual Activity   Alcohol use: Yes    Alcohol/week: 0.0 standard drinks of alcohol   Drug use: Not Currently    Types: Marijuana   Sexual activity: Yes    Partners: Female    Comment: partner is on the pill.   Other Topics Concern   Not on file  Social History Narrative   Not on file   Social Determinants of Health   Financial Resource Strain: Not on file  Food Insecurity: Not on file   Transportation Needs: Not on file  Physical Activity: Not on file  Stress: Not on file  Social Connections: Not on file  Intimate Partner Violence: Not on file     Constitutional: Denies fever, malaise, fatigue, headache or abrupt weight changes.  HEENT: Denies eye pain, eye redness, ear pain, ringing in the ears, wax buildup, runny nose, nasal congestion, bloody nose, or sore throat. Respiratory: Denies difficulty breathing, shortness of breath, cough or sputum production.   Cardiovascular: Denies chest pain, chest tightness, palpitations or swelling in the hands or feet.  Gastrointestinal: Denies abdominal pain, bloating, constipation, diarrhea or blood in the stool.  GU: Denies urgency, frequency, pain with urination, burning sensation, blood in urine, odor or discharge. Musculoskeletal: Patient reports chronic back pain.  Denies decrease in range of motion, difficulty with gait,  or joint swelling.  Skin: Denies redness, rashes, lesions or ulcercations.  Neurological: Denies dizziness, difficulty with memory, difficulty with speech or problems with balance and coordination.  Psych: Patient has a history of anxiety and depression.  Denies SI/HI.  No other specific complaints in a complete review of systems (except as listed in HPI above).  Objective:   Physical Exam  BP 126/76 (BP Location: Left Arm, Patient Position: Sitting, Cuff Size: Large)   Pulse 89   Temp (!) 96.5 F (35.8 C) (Temporal)   Ht '5\' 10"'$  (1.778 m)   Wt 242 lb (109.8 kg)   SpO2 98%   BMI 34.72 kg/m   Wt Readings from Last 3 Encounters:  04/06/22 244 lb (110.7 kg)  02/20/22 240 lb (108.9 kg)  01/05/22 244 lb (110.7 kg)    General: Appears his stated age, obese, in NAD. Skin: Warm, dry and intact. No ulcerations noted. HEENT: Head: normal shape and size; Eyes: sclera white, no icterus, conjunctiva pink, PERRLA and EOMs intact;  Neck:  Neck supple, trachea midline. No masses, lumps or thyromegaly  present.  Cardiovascular: Normal rate and rhythm. S1,S2 noted.  No murmur, rubs or gallops noted. No JVD or BLE edema.  Pulmonary/Chest: Normal effort and positive vesicular breath sounds. No respiratory distress. No wheezes, rales or ronchi noted.  Abdomen: Soft and nontender. Normal bowel sounds.  Musculoskeletal: Strength 5/5 BUE/BLE.  No difficulty with gait.  Neurological: Alert and oriented. Cranial nerves II-XII grossly intact. Coordination normal.  Psychiatric: Mood and affect normal. Behavior is normal. Judgment and thought content normal.     BMET    Component Value Date/Time   NA 140 04/06/2022 0821   NA 136 03/23/2012 1414   K 4.1 04/06/2022 0821   K 3.8 03/23/2012 1414   CL 104 04/06/2022 0821   CL 101 03/23/2012 1414   CO2 27 04/06/2022 0821   CO2 27 03/23/2012 1414   GLUCOSE 163 (H) 04/06/2022 0821   GLUCOSE 92 03/23/2012 1414   BUN 16 04/06/2022 0821   BUN 9 03/23/2012 1414   CREATININE 0.75 04/06/2022 0821   CALCIUM 9.5 04/06/2022 0821   CALCIUM 8.9 03/23/2012 1414   GFRNONAA >60 05/14/2018 1906   GFRNONAA >89 12/05/2015 0916   GFRAA >60 05/14/2018 1906   GFRAA >89 12/05/2015 0916    Lipid Panel     Component Value Date/Time   CHOL 214 (H) 04/06/2022 0821   TRIG 296 (H) 04/06/2022 0821   HDL 28 (L) 04/06/2022 0821   CHOLHDL 7.6 (H) 04/06/2022 0821   VLDL 30.0 10/09/2016 0938   LDLCALC 144 (H) 04/06/2022 0821    CBC    Component Value Date/Time   WBC 6.4 12/29/2021 0844   RBC 5.08 12/29/2021 0844   HGB 15.9 12/29/2021 0844   HGB 15.2 03/23/2012 1414   HCT 46.6 12/29/2021 0844   HCT 43.7 03/23/2012 1414   PLT 272 12/29/2021 0844   PLT 248 03/23/2012 1414   MCV 91.7 12/29/2021 0844   MCV 89 03/23/2012 1414   MCH 31.3 12/29/2021 0844   MCHC 34.1 12/29/2021 0844   RDW 12.2 12/29/2021 0844   RDW 12.7 03/23/2012 1414   LYMPHSABS 3.1 05/14/2018 1906   LYMPHSABS 1.8 03/23/2012 1414   MONOABS 0.7 05/14/2018 1906   MONOABS 0.7 03/23/2012 1414    EOSABS 0.2 05/14/2018 1906   EOSABS 0.1 03/23/2012 1414   BASOSABS 0.1 05/14/2018 1906   BASOSABS 0.1 03/23/2012 1414    Hgb A1C Lab Results  Component Value Date   HGBA1C 7.7 (A) 04/06/2022           Assessment & Plan:   Preventative Health Maintenance:  Encouraged him to get a flu shot in the fall Tetanus UTD Encouraged him to get his COVID booster Pneumovax UTD Referral to GI for screening colonoscopy Encouraged him to consume a balanced diet and exercise  regimen Advised him to see an eye doctor and dentist annually We will check CBC, c-Met, lipid, A1c today  RTC in 3 months, follow-up chronic conditions Webb Silversmith, NP

## 2022-06-29 ENCOUNTER — Encounter: Payer: Self-pay | Admitting: Internal Medicine

## 2022-07-03 ENCOUNTER — Other Ambulatory Visit (HOSPITAL_BASED_OUTPATIENT_CLINIC_OR_DEPARTMENT_OTHER): Payer: 59 | Admitting: Internal Medicine

## 2022-07-06 ENCOUNTER — Other Ambulatory Visit: Payer: Self-pay

## 2022-07-06 DIAGNOSIS — E1165 Type 2 diabetes mellitus with hyperglycemia: Secondary | ICD-10-CM

## 2022-07-09 ENCOUNTER — Other Ambulatory Visit: Payer: 59

## 2022-07-09 DIAGNOSIS — G4733 Obstructive sleep apnea (adult) (pediatric): Secondary | ICD-10-CM | POA: Diagnosis not present

## 2022-07-09 DIAGNOSIS — E1165 Type 2 diabetes mellitus with hyperglycemia: Secondary | ICD-10-CM | POA: Diagnosis not present

## 2022-07-10 ENCOUNTER — Encounter: Payer: Self-pay | Admitting: Internal Medicine

## 2022-07-10 LAB — COMPLETE METABOLIC PANEL WITH GFR
AG Ratio: 1.6 (calc) (ref 1.0–2.5)
ALT: 27 U/L (ref 9–46)
AST: 16 U/L (ref 10–40)
Albumin: 4.7 g/dL (ref 3.6–5.1)
Alkaline phosphatase (APISO): 62 U/L (ref 36–130)
BUN: 14 mg/dL (ref 7–25)
CO2: 25 mmol/L (ref 20–32)
Calcium: 9.7 mg/dL (ref 8.6–10.3)
Chloride: 104 mmol/L (ref 98–110)
Creat: 0.77 mg/dL (ref 0.60–1.26)
Globulin: 2.9 g/dL (calc) (ref 1.9–3.7)
Glucose, Bld: 176 mg/dL — ABNORMAL HIGH (ref 65–99)
Potassium: 4.4 mmol/L (ref 3.5–5.3)
Sodium: 139 mmol/L (ref 135–146)
Total Bilirubin: 0.4 mg/dL (ref 0.2–1.2)
Total Protein: 7.6 g/dL (ref 6.1–8.1)
eGFR: 120 mL/min/{1.73_m2} (ref >=60)

## 2022-07-10 LAB — CBC
HCT: 46.8 % (ref 38.5–50.0)
Hemoglobin: 16 g/dL (ref 13.2–17.1)
MCH: 30.8 pg (ref 27.0–33.0)
MCHC: 34.2 g/dL (ref 32.0–36.0)
MCV: 90 fL (ref 80.0–100.0)
MPV: 9.8 fL (ref 7.5–12.5)
Platelets: 276 10*3/uL (ref 140–400)
RBC: 5.2 10*6/uL (ref 4.20–5.80)
RDW: 12.3 % (ref 11.0–15.0)
WBC: 7.3 10*3/uL (ref 3.8–10.8)

## 2022-07-10 LAB — LIPID PANEL
Cholesterol: 226 mg/dL — ABNORMAL HIGH (ref <200)
HDL: 32 mg/dL — ABNORMAL LOW (ref >=40)
LDL Cholesterol (Calc): 143 mg/dL (calc) — ABNORMAL HIGH
Non-HDL Cholesterol (Calc): 194 mg/dL (calc) — ABNORMAL HIGH (ref <130)
Total CHOL/HDL Ratio: 7.1 (calc) — ABNORMAL HIGH (ref <5.0)
Triglycerides: 333 mg/dL — ABNORMAL HIGH (ref <150)

## 2022-07-10 LAB — HEMOGLOBIN A1C
Hgb A1c MFr Bld: 7.6 % of total Hgb — ABNORMAL HIGH (ref <5.7)
Mean Plasma Glucose: 171 mg/dL
eAG (mmol/L): 9.5 mmol/L

## 2022-07-10 MED ORDER — ATORVASTATIN CALCIUM 10 MG PO TABS: 10.0000 mg | ORAL_TABLET | Freq: Every day | ORAL | 0 refills | Status: DC

## 2022-07-10 MED ORDER — METFORMIN HCL ER 500 MG PO TB24: 500.0000 mg | ORAL_TABLET | Freq: Every day | ORAL | 0 refills | Status: DC

## 2022-07-10 NOTE — Addendum Note (Signed)
Addended by: Jearld Fenton on: 07/10/2022 02:43 PM   Modules accepted: Orders

## 2022-07-17 ENCOUNTER — Ambulatory Visit
Admission: EM | Admit: 2022-07-17 | Discharge: 2022-07-17 | Disposition: A | Discharge status: Home / Self Care | Payer: 59 | Attending: Emergency Medicine | Admitting: Emergency Medicine

## 2022-07-17 DIAGNOSIS — H6002 Abscess of left external ear: Secondary | ICD-10-CM

## 2022-07-17 HISTORY — DX: Type 2 diabetes mellitus without complications: E11.9

## 2022-07-17 MED ORDER — DOXYCYCLINE HYCLATE 100 MG PO CAPS: 100.0000 mg | ORAL_CAPSULE | Freq: Two times a day (BID) | ORAL | 0 refills | Status: DC

## 2022-07-17 NOTE — Discharge Instructions (Signed)
Take the Doxycycline twice daily with food for 10 days.  Doxycycline will make you more sensitive to sunburn so wear sunscreen when outdoors and reapply it every 90 minutes.  Apply warm compresses to help promote drainage.  Use OTC Tylenol and Ibuprofen according to the package instructions as needed for pain.  Return for new or worsening symptoms.   

## 2022-07-17 NOTE — ED Triage Notes (Signed)
Pt states he noticed a "pimple" back of LT ear, pt states he has noticed bump has grown, not draining. Painful to the touch, pt states feels like fluid

## 2022-07-17 NOTE — ED Provider Notes (Signed)
[facility]    CSN: [id number] Arrival date & time: 07/17/22  0802      History   Chief Complaint Chief Complaint  Patient presents with   Ear Problem    LT ear    HPI Jacob Andrews is a 37 y.o. male.   HPI  37 year old male with a past medical history for type 2 diabetes, hypertriglyceridemia, anxiety, depression, IBS, and sleep apnea presents for evaluation of a tender, swollen, red area behind his left ear.  He denies any fever or drainage from the area.  Past Medical History:  Diagnosis Date   Bone spur    right knee   Depression    Diabetes mellitus without complication    IBS (irritable bowel syndrome)    Kidney stone    current stone outside kidney after surgery.    OSA (obstructive sleep apnea)    no CPAP- could not get "adjusted" correctly   Vertigo     Patient Active Problem List   Diagnosis Date Noted   Hypertriglyceridemia 01/05/2022   Type 2 diabetes mellitus with hyperglycemia, without long-term current use of insulin 01/05/2022   Class 1 obesity due to excess calories with body mass index (BMI) of 34.0 to 34.9 in adult 01/05/2022   Anxiety and depression 12/29/2021   Chronic low back pain with right-sided sciatica 12/29/2021   Sleep apnea in adult 11/29/2014   Irritable bowel syndrome (IBS) 11/08/2014    Past Surgical History:  Procedure Laterality Date   APPENDECTOMY  2013   COLONOSCOPY WITH PROPOFOL N/A 12/31/2016   Procedure: COLONOSCOPY WITH PROPOFOL;  Surgeon: Lucilla Lame, MD;  Location: [place];  Service: Gastroenterology;  Laterality: N/A;  sleep apnea   KIDNEY SURGERY  2010   POLYPECTOMY  12/31/2016   Procedure: POLYPECTOMY;  Surgeon: Lucilla Lame, MD;  Location: [place];  Service: Gastroenterology;;   URETERAL STENT PLACEMENT  2012       Home Medications    Prior to Admission medications   Medication Sig Start Date End Date Taking? Authorizing Provider  atorvastatin (LIPITOR) 10 MG tablet  Take 1 tablet (10 mg total) by mouth daily. 07/10/22  Yes Baity, Coralie Keens, NP  doxycycline (VIBRAMYCIN) 100 MG capsule Take 1 capsule (100 mg total) by mouth 2 (two) times daily. 07/17/22  Yes Margarette Canada, NP  metFORMIN (GLUCOPHAGE-XR) 500 MG 24 hr tablet Take 1 tablet (500 mg total) by mouth daily with breakfast. 07/10/22  Yes Baity, Coralie Keens, NP    Family History Family History  Problem Relation Age of Onset   Diabetes Father    Hypertension Mother    Heart disease Maternal Grandmother        heart attack   Heart disease Paternal Grandfather    Stroke Paternal Grandfather     Social History Social History   Tobacco Use   Smoking status: Never   Smokeless tobacco: Never  Vaping Use   Vaping Use: Never used  Substance Use Topics   Alcohol use: Yes    Alcohol/week: 0.0 standard drinks of alcohol   Drug use: Not Currently    Types: Marijuana     Allergies   Cinnamon   Review of Systems Review of Systems  Constitutional:  Negative for fever.  Skin:  Positive for color change.       Swollen, red, tender area behind left ear.     Physical Exam Triage Vital Signs ED Triage Vitals  Enc Vitals Group  BP      Pulse      Resp      Temp      Temp src      SpO2      Weight      Height      Head Circumference      Peak Flow      Pain Score      Pain Loc      Pain Edu?      Excl. in [place]?    No data found.  Updated Vital Signs BP 120/86 (BP Location: Right Arm)   Pulse 93   Temp 98.5 F (36.9 C) (Oral)   Ht 5\' 11"  (1.803 m)   Wt 240 lb (108.9 kg)   SpO2 97%   BMI 33.47 kg/m   Visual Acuity Right Eye Distance:   Left Eye Distance:   Bilateral Distance:    Right Eye Near:   Left Eye Near:    Bilateral Near:     Physical Exam Vitals and nursing note reviewed.  Constitutional:      Appearance: Normal appearance. He is not ill-appearing.  HENT:     Head: Normocephalic and atraumatic.  Skin:    General: Skin is warm and dry.     Findings:  Erythema and lesion present.     Comments: Patient has a swollen, erythematous, fluctuant, tender area in the inferior, posterior aspect of the helix cauda.  There is a visible head but no drainage.  Neurological:     Mental Status: He is alert.      UC Treatments / Results  Labs (all labs ordered are listed, but only abnormal results are displayed) Labs Reviewed - No data to display  EKG   Radiology No results found.  Procedures Procedures (including critical care time)  Medications Ordered in UC Medications - No data to display  Initial Impression / Assessment and Plan / UC Course  I have reviewed the triage vital signs and the nursing notes.  Pertinent labs & imaging results that were available during my care of the patient were reviewed by me and considered in my medical decision making (see chart for details).   The patient is a pleasant, nontoxic-appearing 37 year old male presenting for evaluation of of an abscess behind the left ear that is been there for 4 days.  As you can see in the image above, there is an erythematous, edematous, and fluctuant area behind the ear with a visible head at the inferior aspect.  When cleaning the ear with alcohol the lesion ruptured and approximately 1 cc of pus drained from the ear.  I will discharge patient home with a diagnosis of ear abscess and started on doxycycline 100 mg twice daily for 10 days as patient is a diabetic.  Return precautions reviewed.  Patient denies need for work note.  Final Clinical Impressions(s) / UC Diagnoses   Final diagnoses:  Abscess of external ear, left     Discharge Instructions      Take the Doxycycline twice daily with food for 10 days.  Doxycycline will make you more sensitive to sunburn so wear sunscreen when outdoors and reapply it every 90 minutes.  Apply warm compresses to help promote drainage.  Use OTC Tylenol and Ibuprofen according to the package instructions as needed for  pain.  Return for new or worsening symptoms.       ED Prescriptions     Medication Sig Dispense Auth. Provider   doxycycline (VIBRAMYCIN)  100 MG capsule Take 1 capsule (100 mg total) by mouth 2 (two) times daily. 20 capsule Margarette Canada, NP      PDMP not reviewed this encounter.   Margarette Canada, NP 07/17/22 [phone]

## 2022-07-18 ENCOUNTER — Encounter: Payer: Self-pay | Admitting: Gastroenterology

## 2022-07-18 ENCOUNTER — Other Ambulatory Visit: Payer: Self-pay

## 2022-07-18 ENCOUNTER — Other Ambulatory Visit (HOSPITAL_BASED_OUTPATIENT_CLINIC_OR_DEPARTMENT_OTHER): Payer: 59 | Admitting: Internal Medicine

## 2022-07-20 NOTE — Anesthesia Preprocedure Evaluation (Addendum)
Anesthesia Evaluation  Patient identified by MRN, date of birth, ID band Patient awake    Reviewed: Allergy & Precautions, H&P , NPO status , Patient's Chart, lab work & pertinent test results  Airway Mallampati: III  TM Distance: >3 FB Neck ROM: Full    Dental no notable dental hx.    Pulmonary sleep apnea and Continuous Positive Airway Pressure Ventilation    Pulmonary exam normal breath sounds clear to auscultation       Cardiovascular negative cardio ROS Normal cardiovascular exam Rhythm:Regular Rate:Normal     Neuro/Psych   Anxiety Depression    Hx vertigo Back pain with sciatica negative neurological ROS  negative psych ROS   GI/Hepatic negative GI ROS, Neg liver ROS,,,IBS   Endo/Other  negative endocrine ROSdiabetes, Type 2, Oral Hypoglycemic Agents    Renal/GU negative Renal ROSHx kidney stone  negative genitourinary   Musculoskeletal negative musculoskeletal ROS (+)  Back pain    Abdominal   Peds negative pediatric ROS (+)  Hematology negative hematology ROS (+)   Anesthesia Other Findings IBS (irritable bowel syndrome) Depression Vertigo OSA (obstructive sleep apnea) Bone spur  Kidney stone Diabetes mellitus without complicationBack pain    Reproductive/Obstetrics negative OB ROS                             Anesthesia Physical Anesthesia Plan  ASA: 2  Anesthesia Plan: General   Post-op Pain Management:    Induction: Intravenous  PONV Risk Score and Plan:   Airway Management Planned: Natural Airway and Nasal Cannula  Additional Equipment:   Intra-op Plan:   Post-operative Plan:   Informed Consent: I have reviewed the patients History and Physical, chart, labs and discussed the procedure including the risks, benefits and alternatives for the proposed anesthesia with the patient or authorized representative who has indicated his/her understanding and  acceptance.     Dental Advisory Given  Plan Discussed with: Anesthesiologist, CRNA and Surgeon  Anesthesia Plan Comments: (Patient consented for risks of anesthesia including but not limited to:  - adverse reactions to medications - risk of airway placement if required - damage to eyes, teeth, lips or other oral mucosa - nerve damage due to positioning  - sore throat or hoarseness - Damage to heart, brain, nerves, lungs, other parts of body or loss of life  Patient voiced understanding.)       Anesthesia Quick Evaluation

## 2022-07-30 ENCOUNTER — Encounter
Admission: RE | Disposition: A | Discharge status: Home / Self Care | Payer: Self-pay | Source: Home / Self Care | Attending: Gastroenterology

## 2022-07-30 ENCOUNTER — Other Ambulatory Visit: Payer: Self-pay

## 2022-07-30 ENCOUNTER — Ambulatory Visit
Admission: RE | Admit: 2022-07-30 | Discharge: 2022-07-30 | Disposition: A | Discharge status: Home / Self Care | Payer: 59 | Attending: Gastroenterology | Admitting: Gastroenterology

## 2022-07-30 ENCOUNTER — Ambulatory Visit: Payer: 59 | Admitting: Anesthesiology

## 2022-07-30 DIAGNOSIS — E119 Type 2 diabetes mellitus without complications: Secondary | ICD-10-CM | POA: Diagnosis not present

## 2022-07-30 DIAGNOSIS — K635 Polyp of colon: Secondary | ICD-10-CM | POA: Diagnosis not present

## 2022-07-30 DIAGNOSIS — Z86010 Personal history of colonic polyps: Secondary | ICD-10-CM | POA: Insufficient documentation

## 2022-07-30 DIAGNOSIS — D128 Benign neoplasm of rectum: Secondary | ICD-10-CM | POA: Diagnosis not present

## 2022-07-30 DIAGNOSIS — K640 First degree hemorrhoids: Secondary | ICD-10-CM | POA: Diagnosis not present

## 2022-07-30 DIAGNOSIS — G4730 Sleep apnea, unspecified: Secondary | ICD-10-CM | POA: Diagnosis not present

## 2022-07-30 DIAGNOSIS — Z7984 Long term (current) use of oral hypoglycemic drugs: Secondary | ICD-10-CM | POA: Insufficient documentation

## 2022-07-30 DIAGNOSIS — Z09 Encounter for follow-up examination after completed treatment for conditions other than malignant neoplasm: Secondary | ICD-10-CM | POA: Insufficient documentation

## 2022-07-30 DIAGNOSIS — Z1211 Encounter for screening for malignant neoplasm of colon: Secondary | ICD-10-CM | POA: Diagnosis not present

## 2022-07-30 DIAGNOSIS — K621 Rectal polyp: Secondary | ICD-10-CM | POA: Insufficient documentation

## 2022-07-30 DIAGNOSIS — D124 Benign neoplasm of descending colon: Secondary | ICD-10-CM | POA: Diagnosis not present

## 2022-07-30 HISTORY — PX: POLYPECTOMY: SHX5525

## 2022-07-30 HISTORY — DX: Dorsalgia, unspecified: M54.9

## 2022-07-30 HISTORY — PX: COLONOSCOPY WITH PROPOFOL: SHX5780

## 2022-07-30 LAB — GLUCOSE, CAPILLARY: Glucose-Capillary: 158 mg/dL — ABNORMAL HIGH (ref 70–99)

## 2022-07-30 SURGERY — COLONOSCOPY WITH PROPOFOL
Anesthesia: General | Site: Rectum

## 2022-07-30 MED ORDER — STERILE WATER FOR IRRIGATION IR SOLN
Status: DC | PRN
  Administered 2022-07-30: 100 mL

## 2022-07-30 MED ORDER — SODIUM CHLORIDE 0.9 % IV SOLN: INTRAVENOUS | Status: DC

## 2022-07-30 MED ORDER — PROPOFOL 10 MG/ML IV BOLUS
INTRAVENOUS | Status: DC | PRN
  Administered 2022-07-30: 20 mg via INTRAVENOUS
  Administered 2022-07-30: 50 mg via INTRAVENOUS

## 2022-07-30 MED ORDER — LACTATED RINGERS IV SOLN: INTRAVENOUS | Status: DC

## 2022-07-30 MED ORDER — LIDOCAINE HCL (CARDIAC) PF 100 MG/5ML IV SOSY
PREFILLED_SYRINGE | INTRAVENOUS | Status: DC | PRN
  Administered 2022-07-30: 100 mg via INTRAVENOUS

## 2022-07-30 MED ORDER — STERILE WATER FOR IRRIGATION IR SOLN
Status: DC | PRN
  Administered 2022-07-30: 60 mL

## 2022-07-30 MED ORDER — PROPOFOL 500 MG/50ML IV EMUL
INTRAVENOUS | Status: DC | PRN
  Administered 2022-07-30: 200 ug/kg/min via INTRAVENOUS

## 2022-07-30 SURGICAL SUPPLY — 8 items
GOWN CVR UNV OPN BCK APRN NK (MISCELLANEOUS) ×2 IMPLANT
GOWN ISOL THUMB LOOP REG UNIV (MISCELLANEOUS) ×2
KIT PRC NS LF DISP ENDO (KITS) ×1 IMPLANT
KIT PROCEDURE OLYMPUS (KITS) ×1
MANIFOLD NEPTUNE II (INSTRUMENTS) ×1 IMPLANT
SNARE COLD EXACTO (MISCELLANEOUS) IMPLANT
TRAP ETRAP POLY (MISCELLANEOUS) IMPLANT
WATER STERILE IRR 250ML POUR (IV SOLUTION) ×1 IMPLANT

## 2022-07-30 NOTE — H&P (Signed)
Midge Minium, MD [facility] [address] [place], [place] [address] Phone:[phone] Fax : [phone]  Primary Care Physician:  Lorre Munroe, NP Primary Gastroenterologist:  Dr. Servando Snare  Pre-Procedure History & Physical: HPI:  Jacob Andrews is a 37 y.o. male is here for an colonoscopy.   Past Medical History:  Diagnosis Date   Back pain    Bone spur    right knee   Depression    Diabetes mellitus without complication    Type 2   IBS (irritable bowel syndrome)    Kidney stone    current stone outside kidney after surgery.    OSA (obstructive sleep apnea)    CPAP   Vertigo     Past Surgical History:  Procedure Laterality Date   APPENDECTOMY  2013   COLONOSCOPY WITH PROPOFOL N/A 12/31/2016   Procedure: COLONOSCOPY WITH PROPOFOL;  Surgeon: Midge Minium, MD;  Location: [facility];  Service: Gastroenterology;  Laterality: N/A;  sleep apnea   KIDNEY SURGERY  2010   POLYPECTOMY  12/31/2016   Procedure: POLYPECTOMY;  Surgeon: Midge Minium, MD;  Location: [facility];  Service: Gastroenterology;;   URETERAL STENT PLACEMENT  2012    Prior to Admission medications   Medication Sig Start Date End Date Taking? Authorizing Provider  acetaminophen (TYLENOL) 500 MG tablet Take 500 mg by mouth every 6 (six) hours as needed for moderate pain.   Yes [provider]  atorvastatin (LIPITOR) 10 MG tablet Take 1 tablet (10 mg total) by mouth daily. 07/10/22  Yes Lorre Munroe, NP  metFORMIN (GLUCOPHAGE-XR) 500 MG 24 hr tablet Take 1 tablet (500 mg total) by mouth daily with breakfast. 07/10/22  Yes Baity, Salvadore Oxford, NP  doxycycline (VIBRAMYCIN) 100 MG capsule Take 1 capsule (100 mg total) by mouth 2 (two) times daily. Patient not taking: Reported on 07/30/2022 07/17/22   Becky [place], NP    Allergies as of 06/27/2022 - Review Complete 06/27/2022  Allergen Reaction Noted   Cinnamon Swelling 08/06/2016    Family History  Problem Relation Age of Onset    Diabetes Father    Hypertension Mother    Heart disease Maternal Grandmother        heart attack   Heart disease Paternal Grandfather    Stroke Paternal Grandfather     Social History   Socioeconomic History   Marital status: Single    Spouse name: Not on file   Number of children: Not on file   Years of education: Not on file   Highest education level: Not on file  Occupational History   Not on file  Tobacco Use   Smoking status: Never   Smokeless tobacco: Never  Vaping Use   Vaping Use: Never used  Substance and Sexual Activity   Alcohol use: Yes    Alcohol/week: 0.0 standard drinks of alcohol   Drug use: Not Currently    Types: Marijuana    Comment: maijuana in past   Sexual activity: Yes    Partners: Female    Comment: partner is on the pill.   Other Topics Concern   Not on file  Social History Narrative   Not on file   Social Determinants of Health   Financial Resource Strain: Not on file  Food Insecurity: Not on file  Transportation Needs: Not on file  Physical Activity: Not on file  Stress: Not on file  Social Connections: Not on file  Intimate Partner Violence: Not on file  Review of Systems: See HPI, otherwise negative ROS  Physical Exam: BP 139/87   Pulse 94   Temp 97.9 F (36.6 C) (Temporal)   Ht  (1.803 m)   Wt 108 kg   SpO2 94%   BMI 33.19 kg/m  General:   Alert,  pleasant and cooperative in NAD Head:  Normocephalic and atraumatic. Neck:  Supple; no masses or thyromegaly. Lungs:  Clear throughout to auscultation.    Heart:  Regular rate and rhythm. Abdomen:  Soft, nontender and nondistended. Normal bowel sounds, without guarding, and without rebound.   Neurologic:  Alert and  oriented x4;  grossly normal neurologically.  Impression/Plan: Jacob Andrews is here for an colonoscopy to be performed for a history of adenomatous polyps on 2018   Risks, benefits, limitations, and alternatives regarding  colonoscopy have been  reviewed with the patient.  Questions have been answered.  All parties agreeable.   Midge Minium, MD  07/30/2022, 7:47 AM

## 2022-07-30 NOTE — Op Note (Signed)
[facility] Gastroenterology Patient Name: Jacob Andrews Procedure Date: 07/30/2022 8:17 AM MRN: [id number] Account #: [id number] Date of Birth: 1985/07/25 Admit Type: Outpatient Age: 37 Room: [facility] OR ROOM 01 Gender: Male Note Status: Finalized Instrument Name: [id number] Procedure:             Colonoscopy Indications:           High risk colon cancer surveillance: Personal history                         of colonic polyps Providers:             Midge Minium MD, MD Referring MD:          Lorre Munroe (Referring MD) Medicines:             Propofol per Anesthesia Complications:         No immediate complications. Procedure:             Pre-Anesthesia Assessment:                        - Prior to the procedure, a History and Physical was                         performed, and patient medications and allergies were                         reviewed. The patient's tolerance of previous                         anesthesia was also reviewed. The risks and benefits                         of the procedure and the sedation options and risks                         were discussed with the patient. All questions were                         answered, and informed consent was obtained. Prior                         Anticoagulants: The patient has taken no anticoagulant                         or antiplatelet agents. ASA Grade Assessment: II - A                         patient with mild systemic disease. After reviewing                         the risks and benefits, the patient was deemed in                         satisfactory condition to undergo the procedure.                        After obtaining informed consent, the colonoscope was  passed under direct vision. Throughout the procedure,                         the patient's blood pressure, pulse, and oxygen                         saturations were monitored continuously. The                          Colonoscope was introduced through the anus and                         advanced to the the cecum, identified by appendiceal                         orifice and ileocecal valve. The colonoscopy was                         performed without difficulty. The patient tolerated                         the procedure well. The quality of the bowel                         preparation was excellent. Findings:      The perianal and digital rectal examinations were normal.      A 5 mm polyp was found in the descending colon. The polyp was sessile.       The polyp was removed with a cold snare. Resection and retrieval were       complete.      A 6 mm polyp was found in the rectum. The polyp was pedunculated. The       polyp was removed with a cold snare. Resection and retrieval were       complete.      Non-bleeding internal hemorrhoids were found during retroflexion. The       hemorrhoids were Grade I (internal hemorrhoids that do not prolapse). Impression:            - One 5 mm polyp in the descending colon, removed with                         a cold snare. Resected and retrieved.                        - One 6 mm polyp in the rectum, removed with a cold                         snare. Resected and retrieved.                        - Non-bleeding internal hemorrhoids. Recommendation:        - Discharge patient to home.                        - Resume previous diet.                        - Continue present medications.                        -  Await pathology results.                        - Repeat colonoscopy in 5 years for surveillance. Procedure Code(s):     --- Professional ---                        [id number], Colonoscopy, flexible; with removal of                         tumor(s), polyp(s), or other lesion(s) by snare                         technique Diagnosis Code(s):     --- Professional ---                        Z86.010, Personal history of colonic polyps                        D12.4,  Benign neoplasm of descending colon CPT copyright 2022 [employer]. All rights reserved. The codes documented in this report are preliminary and upon coder review may  be revised to meet current compliance requirements. Midge Minium MD, MD 07/30/2022 8:39:28 AM This report has been signed electronically. Number of Addenda: 0 Note Initiated On: 07/30/2022 8:17 AM Scope Withdrawal Time: 0 hours 8 minutes 31 seconds  Total Procedure Duration: 0 hours 10 minutes 41 seconds  Estimated Blood Loss:  Estimated blood loss: none.      [facility]

## 2022-07-30 NOTE — Addendum Note (Signed)
Addendum  created 07/30/22 1136 by Genia Del, CRNA   Intraprocedure Event edited

## 2022-07-30 NOTE — Transfer of Care (Signed)
Immediate Anesthesia Transfer of Care Note  Patient: Jacob Andrews  Procedure(s) Performed: COLONOSCOPY WITH BIOPSY (Rectum) POLYPECTOMY (Rectum)  Patient Location: PACU  Anesthesia Type:General  Level of Consciousness: awake, alert , and oriented  Airway & Oxygen Therapy: Patient Spontanous Breathing  Post-op Assessment: Report given to RN  Post vital signs: Reviewed and stable  Last Vitals: See PACU flow sheet ; all wnl  Vitals Value Taken Time  BP    Temp    Pulse    Resp    SpO2      Last Pain:  Vitals:   07/30/22 0738  TempSrc: Temporal  PainSc: 0-No pain         Complications: No notable events documented.

## 2022-07-30 NOTE — Anesthesia Postprocedure Evaluation (Signed)
Anesthesia Post Note  Patient: POL HODO  Procedure(s) Performed: COLONOSCOPY WITH BIOPSY (Rectum) POLYPECTOMY (Rectum)  Patient location during evaluation: PACU Anesthesia Type: General Level of consciousness: awake and alert Pain management: pain level controlled Vital Signs Assessment: post-procedure vital signs reviewed and stable Respiratory status: spontaneous breathing, nonlabored ventilation, respiratory function stable and patient connected to nasal cannula oxygen Cardiovascular status: blood pressure returned to baseline and stable Postop Assessment: no apparent nausea or vomiting Anesthetic complications: no   No notable events documented.   Last Vitals:  Vitals:   07/30/22 0855 07/30/22 0900  BP: (!) 121/90   Pulse: 95 89  Resp: 14 20  Temp: 36.5 C   SpO2: 96% 96%    Last Pain:  Vitals:   07/30/22 0855  TempSrc:   PainSc: 0-No pain                 Marisue Humble

## 2022-07-30 NOTE — Addendum Note (Signed)
Addendum  created 07/30/22 1139 by Genia Del, CRNA   Intraprocedure Event edited, Intraprocedure Staff edited

## 2022-07-31 ENCOUNTER — Encounter: Payer: Self-pay | Admitting: Gastroenterology

## 2022-07-31 LAB — SURGICAL PATHOLOGY

## 2022-08-09 DIAGNOSIS — G4733 Obstructive sleep apnea (adult) (pediatric): Secondary | ICD-10-CM | POA: Diagnosis not present

## 2022-09-08 DIAGNOSIS — G4733 Obstructive sleep apnea (adult) (pediatric): Secondary | ICD-10-CM | POA: Diagnosis not present

## 2022-09-26 ENCOUNTER — Other Ambulatory Visit: Payer: Self-pay | Admitting: Internal Medicine

## 2022-09-27 NOTE — Telephone Encounter (Signed)
Requested medication (s) are due for refill today: yes  Requested medication (s) are on the active medication list: yes  Last refill:  07/10/22 #90 0 refills  Future visit scheduled: yes in 5 days  Notes to clinic:  no refills remain. Do you want to refill Rx prior to appt?     Requested Prescriptions  Pending Prescriptions Disp Refills   metFORMIN (GLUCOPHAGE-XR) 500 MG 24 hr tablet [Pharmacy Med Name: METFORMIN HCL ER 500 MG TABLET] 30 tablet 2    Sig: TAKE 1 TABLET BY MOUTH EVERY DAY WITH BREAKFAST     Endocrinology:  Diabetes - Biguanides Failed - 09/26/2022  8:20 AM      Failed - B12 Level in normal range and within 720 days    No results found for: "VITAMINB12"       Failed - CBC within normal limits and completed in the last 12 months    WBC  Date Value Ref Range Status  07/09/2022 7.3 3.8 - 10.8 Thousand/uL Final   RBC  Date Value Ref Range Status  07/09/2022 5.20 4.20 - 5.80 Million/uL Final   Hemoglobin  Date Value Ref Range Status  07/09/2022 16.0 13.2 - 17.1 g/dL Final   HGB  Date Value Ref Range Status  03/23/2012 15.2 13.0 - 18.0 g/dL Final   HCT  Date Value Ref Range Status  07/09/2022 46.8 38.5 - 50.0 % Final  03/23/2012 43.7 40.0 - 52.0 % Final   MCHC  Date Value Ref Range Status  07/09/2022 34.2 32.0 - 36.0 g/dL Final   [facility]  Date Value Ref Range Status  07/09/2022 30.8 27.0 - 33.0 pg Final   MCV  Date Value Ref Range Status  07/09/2022 90.0 80.0 - 100.0 fL Final  03/23/2012 89 80 - 100 fL Final   No results found for: "PLTCOUNTKUC", "LABPLAT", "POCPLA" RDW  Date Value Ref Range Status  07/09/2022 12.3 11.0 - 15.0 % Final  03/23/2012 12.7 11.5 - 14.5 % Final         Passed - Cr in normal range and within 360 days    Creat  Date Value Ref Range Status  07/09/2022 0.77 0.60 - 1.26 mg/dL Final   Creatinine, Urine  Date Value Ref Range Status  04/06/2022 123 20 - 320 mg/dL Final         Passed - HBA1C is between 0 and 7.9 and  within 180 days    Hgb A1c MFr Bld  Date Value Ref Range Status  07/09/2022 7.6 (H) <5.7 % of total Hgb Final    Comment:    For someone without known diabetes, a hemoglobin A1c value of 6.5% or greater indicates that they may have  diabetes and this should be confirmed with a follow-up  test. . For someone with known diabetes, a value <7% indicates  that their diabetes is well controlled and a value  greater than or equal to 7% indicates suboptimal  control. A1c targets should be individualized based on  duration of diabetes, age, comorbid conditions, and  other considerations. . Currently, no consensus exists regarding use of hemoglobin A1c for diagnosis of diabetes for children. .          Passed - eGFR in normal range and within 360 days    GFR, Est African American  Date Value Ref Range Status  12/05/2015 >89 >=60 mL/min Final   GFR calc Af Amer  Date Value Ref Range Status  05/14/2018 >60 >60 mL/min Final  GFR, Est Non African American  Date Value Ref Range Status  12/05/2015 >89 >=60 mL/min Final   GFR calc non Af Amer  Date Value Ref Range Status  05/14/2018 >60 >60 mL/min Final   GFR  Date Value Ref Range Status  10/09/2016 120.94 >60.00 mL/min Final   eGFR  Date Value Ref Range Status  07/09/2022 120 > OR = 60 mL/min/1.76m2 Final         Passed - Valid encounter within last 6 months    Recent Outpatient Visits           3 months ago Encounter for general adult medical examination with abnormal findings   [place] [place], [place] W, NP   5 months ago Type 2 diabetes mellitus with hyperglycemia, without long-term current use of insulin [facility])   [place] [place], [place] W, NP   8 months ago Type 2 diabetes mellitus with hyperglycemia, without long-term current use of insulin [facility])   [place] [facility] [place], Salvadore Oxford, NP   9 months ago Irritable bowel syndrome with diarrhea    [facility] [place], [place] W, NP   2 years ago Acute right-sided low back pain with right-sided sciatica   [place] [facility] [facility], Jodelle Gross, FNP       Future Appointments             In 5 days Baity, Salvadore Oxford, NP [place] [facility]

## 2022-10-02 ENCOUNTER — Ambulatory Visit (INDEPENDENT_AMBULATORY_CARE_PROVIDER_SITE_OTHER): Payer: 59 | Admitting: Internal Medicine

## 2022-10-02 ENCOUNTER — Encounter: Payer: Self-pay | Admitting: Internal Medicine

## 2022-10-02 VITALS — BP 124/84 | HR 97 | Temp 96.8°F | Wt 240.0 lb

## 2022-10-02 DIAGNOSIS — K580 Irritable bowel syndrome with diarrhea: Secondary | ICD-10-CM | POA: Diagnosis not present

## 2022-10-02 DIAGNOSIS — G8929 Other chronic pain: Secondary | ICD-10-CM

## 2022-10-02 DIAGNOSIS — Z6833 Body mass index (BMI) 33.0-33.9, adult: Secondary | ICD-10-CM

## 2022-10-02 DIAGNOSIS — M5441 Lumbago with sciatica, right side: Secondary | ICD-10-CM

## 2022-10-02 DIAGNOSIS — E1169 Type 2 diabetes mellitus with other specified complication: Secondary | ICD-10-CM

## 2022-10-02 DIAGNOSIS — F32A Depression, unspecified: Secondary | ICD-10-CM

## 2022-10-02 DIAGNOSIS — F419 Anxiety disorder, unspecified: Secondary | ICD-10-CM

## 2022-10-02 DIAGNOSIS — E6609 Other obesity due to excess calories: Secondary | ICD-10-CM

## 2022-10-02 DIAGNOSIS — E1165 Type 2 diabetes mellitus with hyperglycemia: Secondary | ICD-10-CM | POA: Diagnosis not present

## 2022-10-02 DIAGNOSIS — E785 Hyperlipidemia, unspecified: Secondary | ICD-10-CM

## 2022-10-02 DIAGNOSIS — G4730 Sleep apnea, unspecified: Secondary | ICD-10-CM

## 2022-10-02 LAB — POCT GLYCOSYLATED HEMOGLOBIN (HGB A1C): Hemoglobin A1C: 7.2 % — AB (ref 4.0–5.6)

## 2022-10-02 MED ORDER — METFORMIN HCL ER 500 MG PO TB24: ORAL_TABLET | ORAL | 0 refills | Status: DC

## 2022-10-02 MED ORDER — ATORVASTATIN CALCIUM 10 MG PO TABS: 10.0000 mg | ORAL_TABLET | Freq: Every day | ORAL | 0 refills | Status: DC

## 2022-10-02 NOTE — Assessment & Plan Note (Signed)
POCT A1c 7.2% Urine microalbumin has been checked within the last year Encourage low-carb diet and exercise for weight loss Continue metformin, refilled today Encouraged routine eye exam Encouraged routine foot exam

## 2022-10-02 NOTE — Progress Notes (Signed)
Subjective:    Patient ID: Jacob Andrews, male    DOB: [birth year], 37 y.o.   MRN: [id number]  HPI  Patient presents to clinic today for 72-month follow-up of chronic conditions.  OSA: He averages 7 hours of sleep per night without the use of CPAP.  Sleep study from 04/2022 reviewed.  IBS: He reports mainly diarrhea but this has not been an issue lately.  He takes imodium as needed with some relief of symptoms.  Colonoscopy from 07/2022 reviewed.  Anxiety and Depression: He is not currently taking any medications for this.  He is not currently seeing a therapist.  He denies SI/HI.  HLD: His last LDL was 143, triglycerides 333, 06/2022.  He denies myalgias on atorvastatin.  He does not consume low-fat diet.  Sciatica: Status post surgical intervention in 2022.  He takes ibuprofen and Tylenol OTC as needed with some relief of symptoms.  He no longer follows with neurosurgery.  DM2: His last A1c was 7.6%, 06/2022.  He is to see metformin as per diabetic.  He does not check his sugars regularly.  He does not check his feet routinely.  His last eye exam was 02/2022.  Flu 12/2021.  Pneumovax 12/2021.  COVID Pfizer x 2.  Review of Systems     Past Medical History:  Diagnosis Date   Back pain    Bone spur    right knee   Depression    Diabetes mellitus without complication (HCC)    Type 2   IBS (irritable bowel syndrome)    Kidney stone    current stone outside kidney after surgery.    OSA (obstructive sleep apnea)    CPAP   Vertigo     Current Outpatient Medications  Medication Sig Dispense Refill   acetaminophen (TYLENOL) 500 MG tablet Take 500 mg by mouth every 6 (six) hours as needed for moderate pain.     atorvastatin (LIPITOR) 10 MG tablet Take 1 tablet (10 mg total) by mouth daily. 90 tablet 0   doxycycline (VIBRAMYCIN) 100 MG capsule Take 1 capsule (100 mg total) by mouth 2 (two) times daily. (Patient not taking: Reported on 07/30/2022) 20 capsule 0   metFORMIN (GLUCOPHAGE-XR)  500 MG 24 hr tablet TAKE 1 TABLET BY MOUTH EVERY DAY WITH BREAKFAST 90 tablet 0   No current facility-administered medications for this visit.    Allergies  Allergen Reactions   Cinnamon Swelling    Lip and mouth swellig    Family History  Problem Relation Age of Onset   Diabetes Father    Hypertension Mother    Heart disease Maternal Grandmother        heart attack   Heart disease Paternal Grandfather    Stroke Paternal Grandfather     Social History   Socioeconomic History   Marital status: Single    Spouse name: Not on file   Number of children: Not on file   Years of education: Not on file   Highest education level: Not on file  Occupational History   Not on file  Tobacco Use   Smoking status: Never   Smokeless tobacco: Never  Vaping Use   Vaping Use: Never used  Substance and Sexual Activity   Alcohol use: Yes    Alcohol/week: 0.0 standard drinks of alcohol   Drug use: Not Currently    Types: Marijuana    Comment: maijuana in past   Sexual activity: Yes    Partners: Female    Comment:  partner is on the pill.   Other Topics Concern   Not on file  Social History Narrative   Not on file   Social Determinants of Health   Financial Resource Strain: Not on file  Food Insecurity: Not on file  Transportation Needs: Not on file  Physical Activity: Not on file  Stress: Not on file  Social Connections: Not on file  Intimate Partner Violence: Not on file     Constitutional: Denies fever, malaise, fatigue, headache or abrupt weight changes.  HEENT: Denies eye pain, eye redness, ear pain, ringing in the ears, wax buildup, runny nose, nasal congestion, bloody nose, or sore throat. Respiratory: Denies difficulty breathing, shortness of breath, cough or sputum production.   Cardiovascular: Denies chest pain, chest tightness, palpitations or swelling in the hands or feet.  Gastrointestinal: Patient reports intermittent diarrhea.  Denies abdominal pain, bloating,  constipation, or blood in the stool.  GU: Denies urgency, frequency, pain with urination, burning sensation, blood in urine, odor or discharge. Musculoskeletal: Patient reports intermittent back pain.  Denies decrease in range of motion, difficulty with gait, muscle pain or joint swelling.  Skin: Denies redness, rashes, lesions or ulcercations.  Neurological: Denies dizziness, difficulty with memory, difficulty with speech or problems with balance and coordination.  Psych: Patient has a history of anxiety and depression.  Denies SI/HI.  No other specific complaints in a complete review of systems (except as listed in HPI above).  Objective:   Physical Exam  BP 124/84 (BP Location: Right Arm, Patient Position: Sitting, Cuff Size: Large)   Pulse 97   Temp (!) 96.8 F (36 C) (Temporal)   Wt 240 lb (108.9 kg)   SpO2 99%   BMI 33.47 kg/m   Wt Readings from Last 3 Encounters:  07/30/22 238 lb (108 kg)  07/17/22 240 lb (108.9 kg)  06/27/22 242 lb (109.8 kg)    General: Appears his stated age, obese, in NAD. Skin: Warm, dry and intact. No ulcerations noted. HEENT: Head: normal shape and size; Eyes: sclera white, no icterus, conjunctiva pink, PERRLA and EOMs intact;  Neck:  Neck supple, trachea midline. No masses, lumps or thyromegaly present.  Cardiovascular: Normal rate and rhythm. S1,S2 noted.  No murmur, rubs or gallops noted. No JVD or BLE edema.  Pulmonary/Chest: Normal effort and positive vesicular breath sounds. No respiratory distress. No wheezes, rales or ronchi noted.  Abdomen: Normal bowel sounds.  Musculoskeletal: No difficulty with gait.  Neurological: Alert and oriented. Coordination normal.  Psychiatric: Mood and affect normal. Behavior is normal. Judgment and thought content normal.    BMET    Component Value Date/Time   NA 139 07/09/2022 0807   NA 136 03/23/2012 1414   K 4.4 07/09/2022 0807   K 3.8 03/23/2012 1414   CL 104 07/09/2022 0807   CL 101 03/23/2012  1414   CO2 25 07/09/2022 0807   CO2 27 03/23/2012 1414   GLUCOSE 176 (H) 07/09/2022 0807   GLUCOSE 92 03/23/2012 1414   BUN 14 07/09/2022 0807   BUN 9 03/23/2012 1414   CREATININE 0.77 07/09/2022 0807   CALCIUM 9.7 07/09/2022 0807   CALCIUM 8.9 03/23/2012 1414   GFRNONAA >60 05/14/2018 1906   GFRNONAA >89 12/05/2015 0916   GFRAA >60 05/14/2018 1906   GFRAA >89 12/05/2015 0916    Lipid Panel     Component Value Date/Time   CHOL 226 (H) 07/09/2022 0807   TRIG 333 (H) 07/09/2022 0807   HDL 32 (L) 07/09/2022 4098  CHOLHDL 7.1 (H) 07/09/2022 0807   VLDL 30.0 10/09/2016 0938   LDLCALC 143 (H) 07/09/2022 0807    CBC    Component Value Date/Time   WBC 7.3 07/09/2022 0807   RBC 5.20 07/09/2022 0807   HGB 16.0 07/09/2022 0807   HGB 15.2 03/23/2012 1414   HCT 46.8 07/09/2022 0807   HCT 43.7 03/23/2012 1414   PLT 276 07/09/2022 0807   PLT 248 03/23/2012 1414   MCV 90.0 07/09/2022 0807   MCV 89 03/23/2012 1414   MCH 30.8 07/09/2022 0807   MCHC 34.2 07/09/2022 0807   RDW 12.3 07/09/2022 0807   RDW 12.7 03/23/2012 1414   LYMPHSABS 3.1 05/14/2018 1906   LYMPHSABS 1.8 03/23/2012 1414   MONOABS 0.7 05/14/2018 1906   MONOABS 0.7 03/23/2012 1414   EOSABS 0.2 05/14/2018 1906   EOSABS 0.1 03/23/2012 1414   BASOSABS 0.1 05/14/2018 1906   BASOSABS 0.1 03/23/2012 1414    Hgb A1C Lab Results  Component Value Date   HGBA1C 7.6 (H) 07/09/2022            Assessment & Plan:    No need for follow-up appointment as he is moving to [facility], NP

## 2022-10-02 NOTE — Assessment & Plan Note (Signed)
Encourage regular stretching and core strengthening Okay to continue Tylenol ibuprofen OTC as needed

## 2022-10-02 NOTE — Assessment & Plan Note (Signed)
Encouraged high-fiber diet Continue Imodium OTC as needed

## 2022-10-02 NOTE — Patient Instructions (Signed)

## 2022-10-02 NOTE — Assessment & Plan Note (Signed)
Stable off meds Support offered 

## 2022-10-02 NOTE — Assessment & Plan Note (Signed)
Noncompliant with CPAP Encourage weight loss as this can help reduce sleep apnea symptoms 

## 2022-10-02 NOTE — Assessment & Plan Note (Signed)
Encouraged to consume low-fat diet Continue atorvastatin, refill today

## 2022-10-02 NOTE — Assessment & Plan Note (Signed)
Encourage diet and exercise for weight loss 

## 2022-10-10 ENCOUNTER — Ambulatory Visit: Payer: 59 | Admitting: Internal Medicine

## 2023-02-06 ENCOUNTER — Other Ambulatory Visit: Payer: Self-pay | Admitting: Internal Medicine

## 2023-02-07 NOTE — Telephone Encounter (Signed)
Requested Prescriptions  Pending Prescriptions Disp Refills   atorvastatin (LIPITOR) 10 MG tablet [Pharmacy Med Name: ATORVASTATIN 10 MG TABLET] 30 tablet 2    Sig: TAKE 1 TABLET BY MOUTH EVERY DAY     Cardiovascular:  Antilipid - Statins Failed - 02/06/2023  1:13 AM      Failed - Lipid Panel in normal range within the last 12 months    Cholesterol  Date Value Ref Range Status  07/09/2022 226 (H) <200 mg/dL Final   LDL Cholesterol (Calc)  Date Value Ref Range Status  07/09/2022 143 (H) mg/dL (calc) Final    Comment:    Reference range: <100 . Desirable range <100 mg/dL for primary prevention;   <70 mg/dL for patients with CHD or diabetic patients  with > or = 2 CHD risk factors. Marland Kitchen LDL-C is now calculated using the Martin-Hopkins  calculation, which is a validated novel method providing  better accuracy than the Friedewald equation in the  estimation of LDL-C.  Horald Pollen et al. Lenox Ahr. 1610;960(45): 2061-2068  (http://education.[employer].com/faq/FAQ164)    HDL  Date Value Ref Range Status  07/09/2022 32 (L) > OR = 40 mg/dL Final   Triglycerides  Date Value Ref Range Status  07/09/2022 333 (H) <150 mg/dL Final    Comment:    . If a non-fasting specimen was collected, consider repeat triglyceride testing on a fasting specimen if clinically indicated.  Perry Mount et al. J. of Clin. Lipidol. 2015;9:129-169. Marland Kitchen          Passed - Patient is not pregnant      Passed - Valid encounter within last 12 months    Recent Outpatient Visits           4 months ago Type 2 diabetes mellitus with hyperglycemia, without long-term current use of insulin [facility])   [place] [place], Salvadore Oxford, NP   7 months ago Encounter for general adult medical examination with abnormal findings   [place] [facility] [place], Salvadore Oxford, NP   10 months ago Type 2 diabetes mellitus with hyperglycemia, without long-term current use of insulin [facility])   [facility] [place], [place] W, NP   1 year ago Type 2 diabetes mellitus with hyperglycemia, without long-term current use of insulin [facility])    [place] [facility] [place], Salvadore Oxford, NP   1 year ago Irritable bowel syndrome with diarrhea   [facility] [place], Salvadore Oxford, [place]

## 2023-05-09 ENCOUNTER — Encounter: Payer: Self-pay | Admitting: Internal Medicine

## 2023-05-09 ENCOUNTER — Other Ambulatory Visit: Payer: Self-pay | Admitting: Internal Medicine

## 2023-05-09 MED ORDER — METFORMIN HCL ER 500 MG PO TB24: ORAL_TABLET | ORAL | 0 refills | Status: AC

## 2023-05-09 NOTE — Telephone Encounter (Signed)
Requested medication (s) are due for refill today: yes  Requested medication (s) are on the active medication list: yes  Last refill:  10/02/22  Future visit scheduled: no  Notes to clinic:  Unable to refill per protocol due to failed labs, no updated A1c results.      Requested Prescriptions  Pending Prescriptions Disp Refills   metFORMIN (GLUCOPHAGE-XR) 500 MG 24 hr tablet [Pharmacy Med Name: METFORMIN HCL ER 500 MG TABLET] 30 tablet 2    Sig: TAKE 1 TABLET BY MOUTH EVERY DAY WITH BREAKFAST     Endocrinology:  Diabetes - Biguanides Failed - 05/09/2023  8:58 AM      Failed - HBA1C is between 0 and 7.9 and within 180 days    Hemoglobin A1C  Date Value Ref Range Status  10/02/2022 7.2 (A) 4.0 - 5.6 % Final   Hgb A1c MFr Bld  Date Value Ref Range Status  07/09/2022 7.6 (H) <5.7 % of total Hgb Final    Comment:    For someone without known diabetes, a hemoglobin A1c value of 6.5% or greater indicates that they may have  diabetes and this should be confirmed with a follow-up  test. . For someone with known diabetes, a value <7% indicates  that their diabetes is well controlled and a value  greater than or equal to 7% indicates suboptimal  control. A1c targets should be individualized based on  duration of diabetes, age, comorbid conditions, and  other considerations. . Currently, no consensus exists regarding use of hemoglobin A1c for diagnosis of diabetes for children. .          Failed - B12 Level in normal range and within 720 days    No results found for: "VITAMINB12"       Failed - Valid encounter within last 6 months    Recent Outpatient Visits           7 months ago Type 2 diabetes mellitus with hyperglycemia, without long-term current use of insulin [facility])   [facility] [place], Salvadore Oxford, NP   10 months ago Encounter for general adult medical examination with abnormal findings   [place] [facility] [place], [place]  W, NP   1 year ago Type 2 diabetes mellitus with hyperglycemia, without long-term current use of insulin [facility] [place] [facility])   [facility], [place] W, NP   1 year ago Type 2 diabetes mellitus with hyperglycemia, without long-term current use of insulin [facility])   [facility] [place], Salvadore Oxford, NP   1 year ago Irritable bowel syndrome with diarrhea    [facility] [place] [facility] [place], [place] W, NP              Failed - CBC within normal limits and completed in the last 12 months    WBC  Date Value Ref Range Status  07/09/2022 7.3 3.8 - 10.8 Thousand/uL Final   RBC  Date Value Ref Range Status  07/09/2022 5.20 4.20 - 5.80 Million/uL Final   Hemoglobin  Date Value Ref Range Status  07/09/2022 16.0 13.2 - 17.1 g/dL Final   HGB  Date Value Ref Range Status  03/23/2012 15.2 13.0 - 18.0 g/dL Final   HCT  Date Value Ref Range Status  07/09/2022 46.8 38.5 - 50.0 % Final  03/23/2012 43.7 40.0 - 52.0 % Final   MCHC  Date Value Ref Range Status  07/09/2022 34.2 32.0 -  36.0 g/dL Final   [facility]  Date Value Ref Range Status  07/09/2022 30.8 27.0 - 33.0 pg Final   MCV  Date Value Ref Range Status  07/09/2022 90.0 80.0 - 100.0 fL Final  03/23/2012 89 80 - 100 fL Final   No results found for: "PLTCOUNTKUC", "LABPLAT", "POCPLA" RDW  Date Value Ref Range Status  07/09/2022 12.3 11.0 - 15.0 % Final  03/23/2012 12.7 11.5 - 14.5 % Final         Passed - Cr in normal range and within 360 days    Creat  Date Value Ref Range Status  07/09/2022 0.77 0.60 - 1.26 mg/dL Final   Creatinine, Urine  Date Value Ref Range Status  04/06/2022 123 20 - 320 mg/dL Final         Passed - eGFR in normal range and within 360 days    GFR, Est African American  Date Value Ref Range Status  12/05/2015 >89 >=60 mL/min Final   GFR calc Af Amer  Date Value Ref Range Status  05/14/2018 >60 >60 mL/min Final   GFR, Est Non African American   Date Value Ref Range Status  12/05/2015 >89 >=60 mL/min Final   GFR calc non Af Amer  Date Value Ref Range Status  05/14/2018 >60 >60 mL/min Final   GFR  Date Value Ref Range Status  10/09/2016 120.94 >60.00 mL/min Final   eGFR  Date Value Ref Range Status  07/09/2022 120 > OR = 60 mL/min/1.18m2 Final

## 2023-06-12 ENCOUNTER — Other Ambulatory Visit: Payer: Self-pay | Admitting: Internal Medicine

## 2023-06-12 NOTE — Telephone Encounter (Signed)
 Requested Prescriptions  Pending Prescriptions Disp Refills   atorvastatin (LIPITOR) 10 MG tablet [Pharmacy Med Name: ATORVASTATIN 10 MG TABLET] 30 tablet 0    Sig: TAKE 1 TABLET BY MOUTH EVERY DAY     Cardiovascular:  Antilipid - Statins Failed - 06/12/2023  3:15 PM      Failed - Lipid Panel in normal range within the last 12 months    Cholesterol  Date Value Ref Range Status  07/09/2022 226 (H) <200 mg/dL Final   LDL Cholesterol (Calc)  Date Value Ref Range Status  07/09/2022 143 (H) mg/dL (calc) Final    Comment:    Reference range: <100 . Desirable range <100 mg/dL for primary prevention;   <70 mg/dL for patients with CHD or diabetic patients  with > or = 2 CHD risk factors. Marland Kitchen LDL-C is now calculated using the Martin-Hopkins  calculation, which is a validated novel method providing  better accuracy than the Friedewald equation in the  estimation of LDL-C.  Horald Pollen et al. Lenox Ahr. 8295;621(30): 2061-2068  (http://education.[facility].com/faq/FAQ164)    HDL  Date Value Ref Range Status  07/09/2022 32 (L) > OR = 40 mg/dL Final   Triglycerides  Date Value Ref Range Status  07/09/2022 333 (H) <150 mg/dL Final    Comment:    . If a non-fasting specimen was collected, consider repeat triglyceride testing on a fasting specimen if clinically indicated.  Perry Mount et al. J. of Clin. Lipidol. 2015;9:129-169. Marland Kitchen          Passed - Patient is not pregnant      Passed - Valid encounter within last 12 months    Recent Outpatient Visits           8 months ago Type 2 diabetes mellitus with hyperglycemia, without long-term current use of insulin [facility])   [place] [facility] [place], Salvadore Oxford, NP   11 months ago Encounter for general adult medical examination with abnormal findings   [place] [place], [place] W, NP   1 year ago Type 2 diabetes mellitus with hyperglycemia, without long-term current use of insulin [facility])   [facility] [place], [place], NP   1 year ago Type 2 diabetes mellitus with hyperglycemia, without long-term current use of insulin [facility])   [place] [facility] [place], Salvadore Oxford, NP   1 year ago Irritable bowel syndrome with diarrhea   [facility] [place] [facility] [place], Salvadore Oxford, [place]

## 2023-07-17 ENCOUNTER — Other Ambulatory Visit: Payer: Self-pay | Admitting: Internal Medicine

## 2023-07-18 NOTE — Telephone Encounter (Signed)
 Requested medication (s) are due for refill today: Yes  Requested medication (s) are on the active medication list: Yes  Last refill:  06/12/23, #30, 0 refills   Future visit scheduled: No  Notes to clinic:  Unable to refill per protocol, appointment needed.      Requested Prescriptions  Pending Prescriptions Disp Refills   atorvastatin (LIPITOR) 10 MG tablet [Pharmacy Med Name: ATORVASTATIN 10 MG TABLET] 30 tablet 0    Sig: TAKE 1 TABLET BY MOUTH EVERY DAY     Cardiovascular:  Antilipid - Statins Failed - 07/18/2023  4:48 PM      Failed - Valid encounter within last 12 months    Recent Outpatient Visits   None            Failed - Lipid Panel in normal range within the last 12 months    Cholesterol  Date Value Ref Range Status  07/09/2022 226 (H) <200 mg/dL Final   LDL Cholesterol (Calc)  Date Value Ref Range Status  07/09/2022 143 (H) mg/dL (calc) Final    Comment:    Reference range: <100 . Desirable range <100 mg/dL for primary prevention;   <70 mg/dL for patients with CHD or diabetic patients  with > or = 2 CHD risk factors. Marland Kitchen LDL-C is now calculated using the Martin-Hopkins  calculation, which is a validated novel method providing  better accuracy than the Friedewald equation in the  estimation of LDL-C.  Horald Pollen et al. Lenox Ahr. 1610;960(45): 2061-2068  (http://education.[facility].com/faq/FAQ164)    HDL  Date Value Ref Range Status  07/09/2022 32 (L) > OR = 40 mg/dL Final   Triglycerides  Date Value Ref Range Status  07/09/2022 333 (H) <150 mg/dL Final    Comment:    . If a non-fasting specimen was collected, consider repeat triglyceride testing on a fasting specimen if clinically indicated.  Perry Mount et al. J. of Clin. Lipidol. 2015;9:129-169. Marland Kitchen          Passed - Patient is not pregnant

## 2023-07-18 NOTE — Telephone Encounter (Signed)
 Called patient to schedule an appointment, no answer, voicemail left to call the office. Mychart message sent as well.

## 2023-07-19 ENCOUNTER — Telehealth: Payer: Self-pay

## 2023-07-19 ENCOUNTER — Other Ambulatory Visit: Payer: Self-pay

## 2023-07-19 MED ORDER — ATORVASTATIN CALCIUM 10 MG PO TABS: 10.0000 mg | ORAL_TABLET | Freq: Every day | ORAL | 1 refills | Status: AC

## 2023-07-19 NOTE — Telephone Encounter (Signed)
 Copied from CRM [id number]. Topic: General - Call Back - No Documentation >> Jul 18, 2023  4:55 PM DeAngela L wrote: Reason for CRM: [place] it to an appt in the [facility] system, the patient has found a new provider near his new address but the appt will be after he has run out of medication and the patient is not sure what he should do about this Ok to respond through MyChart and also call back number is [phone]

## 2023-07-19 NOTE — Telephone Encounter (Signed)
 When is his new patient appointment?  If in the next 3 to 6 months, we could get his medication refilled for him.

## 2023-10-14 ENCOUNTER — Other Ambulatory Visit: Payer: Self-pay | Admitting: Internal Medicine

## 2023-10-15 NOTE — Telephone Encounter (Signed)
 Requested Prescriptions  Refused Prescriptions Disp Refills   atorvastatin  (LIPITOR) 10 MG tablet [Pharmacy Med Name: ATORVASTATIN  10 MG TABLET] 90 tablet     Sig: TAKE 1 TABLET BY MOUTH EVERY DAY     Cardiovascular:  Antilipid - Statins Failed - 10/15/2023  2:34 PM      Failed - Valid encounter within last 12 months    Recent Outpatient Visits   None            Failed - Lipid Panel in normal range within the last 12 months    Cholesterol  Date Value Ref Range Status  07/09/2022 226 (H) <200 mg/dL Final   LDL Cholesterol (Calc)  Date Value Ref Range Status  07/09/2022 143 (H) mg/dL (calc) Final    Comment:    Reference range: <100 . Desirable range <100 mg/dL for primary prevention;   <70 mg/dL for patients with CHD or diabetic patients  with > or = 2 CHD risk factors. SABRA LDL-C is now calculated using the Martin-Hopkins  calculation, which is a validated novel method providing  better accuracy than the Friedewald equation in the  estimation of LDL-C.  Gladis APPLETHWAITE et al. SANDREA. 7986;689(80): 2061-2068  (http://education.[facility].com/faq/FAQ164)    HDL  Date Value Ref Range Status  07/09/2022 32 (L) > OR = 40 mg/dL Final   Triglycerides  Date Value Ref Range Status  07/09/2022 333 (H) <150 mg/dL Final    Comment:    . If a non-fasting specimen was collected, consider repeat triglyceride testing on a fasting specimen if clinically indicated.  Veatrice et al. J. of Clin. Lipidol. 2015;9:129-169. SABRA          Passed - Patient is not pregnant

## 2023-12-16 ENCOUNTER — Other Ambulatory Visit: Payer: Self-pay | Admitting: Internal Medicine

## 2023-12-17 NOTE — Telephone Encounter (Signed)
 Unable to refill per protocol, appointment needed.   Requested Prescriptions  Pending Prescriptions Disp Refills   atorvastatin  (LIPITOR) 10 MG tablet [Pharmacy Med Name: ATORVASTATIN  10 MG TABLET] 30 tablet 1    Sig: TAKE 1 TABLET BY MOUTH EVERY DAY     Cardiovascular:  Antilipid - Statins Failed - 12/17/2023  3:35 PM      Failed - Valid encounter within last 12 months    Recent Outpatient Visits   None            Failed - Lipid Panel in normal range within the last 12 months    Cholesterol  Date Value Ref Range Status  07/09/2022 226 (H) <200 mg/dL Final   LDL Cholesterol (Calc)  Date Value Ref Range Status  07/09/2022 143 (H) mg/dL (calc) Final    Comment:    Reference range: <100 . Desirable range <100 mg/dL for primary prevention;   <70 mg/dL for patients with CHD or diabetic patients  with > or = 2 CHD risk factors. SABRA LDL-C is now calculated using the Martin-Hopkins  calculation, which is a validated novel method providing  better accuracy than the Friedewald equation in the  estimation of LDL-C.  Gladis APPLETHWAITE et al. SANDREA. 7986;689(80): 2061-2068  (http://education.[facility].com/faq/FAQ164)    HDL  Date Value Ref Range Status  07/09/2022 32 (L) > OR = 40 mg/dL Final   Triglycerides  Date Value Ref Range Status  07/09/2022 333 (H) <150 mg/dL Final    Comment:    . If a non-fasting specimen was collected, consider repeat triglyceride testing on a fasting specimen if clinically indicated.  Veatrice et al. J. of Clin. Lipidol. 2015;9:129-169. SABRA          Passed - Patient is not pregnant
